# Patient Record
Sex: Female | Born: 1995 | Race: White | Hispanic: No | Marital: Single | Smoking: Former smoker
Health system: Southern US, Community
[De-identification: ages and names within clinical notes are randomized; demographics above are authoritative.]

## PROBLEM LIST (undated history)

## (undated) DIAGNOSIS — N949 Unspecified condition associated with female genital organs and menstrual cycle: Secondary | ICD-10-CM

## (undated) DIAGNOSIS — L723 Sebaceous cyst: Secondary | ICD-10-CM

## (undated) DIAGNOSIS — F329 Major depressive disorder, single episode, unspecified: Secondary | ICD-10-CM

## (undated) DIAGNOSIS — F419 Anxiety disorder, unspecified: Secondary | ICD-10-CM

## (undated) DIAGNOSIS — R519 Headache, unspecified: Secondary | ICD-10-CM

## (undated) DIAGNOSIS — T4145XA Adverse effect of unspecified anesthetic, initial encounter: Secondary | ICD-10-CM

## (undated) DIAGNOSIS — R071 Chest pain on breathing: Secondary | ICD-10-CM

## (undated) DIAGNOSIS — F32A Depression, unspecified: Secondary | ICD-10-CM

## (undated) DIAGNOSIS — T8859XA Other complications of anesthesia, initial encounter: Secondary | ICD-10-CM

## (undated) DIAGNOSIS — R51 Headache: Secondary | ICD-10-CM

## (undated) DIAGNOSIS — R599 Enlarged lymph nodes, unspecified: Secondary | ICD-10-CM

## (undated) DIAGNOSIS — M25559 Pain in unspecified hip: Secondary | ICD-10-CM

## (undated) DIAGNOSIS — M25569 Pain in unspecified knee: Secondary | ICD-10-CM

## (undated) HISTORY — DX: Enlarged lymph nodes, unspecified: R59.9

## (undated) HISTORY — PX: KNEE SURGERY: SHX244

## (undated) HISTORY — DX: Pain in unspecified knee: M25.569

## (undated) HISTORY — DX: Chest pain on breathing: R07.1

## (undated) HISTORY — DX: Sebaceous cyst: L72.3

## (undated) HISTORY — PX: MOUTH SURGERY: SHX715

## (undated) HISTORY — DX: Unspecified condition associated with female genital organs and menstrual cycle: N94.9

## (undated) HISTORY — DX: Pain in unspecified hip: M25.559

## (undated) HISTORY — PX: TONSILLECTOMY: SUR1361

---
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
## 2016-01-05 ENCOUNTER — Ambulatory Visit: Payer: BLUE CROSS/BLUE SHIELD | Admitting: Family Medicine

## 2016-01-05 DIAGNOSIS — Z0289 Encounter for other administrative examinations: Secondary | ICD-10-CM

## 2016-02-10 ENCOUNTER — Telehealth: Payer: Self-pay | Admitting: Family Medicine

## 2016-02-10 NOTE — Telephone Encounter (Signed)
° ° ° °  Pt request refill of the following: ° ° °amphetamine-dextroamphetamine (ADDERALL XR) 20 MG 24 hr capsule ° ° °Phamacy: °

## 2016-02-11 ENCOUNTER — Other Ambulatory Visit: Payer: Self-pay | Admitting: Emergency Medicine

## 2016-02-11 MED ORDER — AMPHETAMINE-DEXTROAMPHET ER 20 MG PO CP24: 20.0000 mg | ORAL_CAPSULE | Freq: Two times a day (BID) | ORAL | 0 refills | Status: DC

## 2016-02-11 NOTE — Telephone Encounter (Signed)
Called and informed pt that prescription has been printed and signed. Upfront ready for pick up nothing further needed at this time.

## 2016-02-21 ENCOUNTER — Other Ambulatory Visit: Payer: Self-pay | Admitting: Family Medicine

## 2016-03-21 ENCOUNTER — Other Ambulatory Visit: Payer: Self-pay | Admitting: Family Medicine

## 2016-03-21 NOTE — Telephone Encounter (Signed)
Pt needs new rx generic adderall xr 30 °

## 2016-03-22 ENCOUNTER — Other Ambulatory Visit: Payer: Self-pay | Admitting: Emergency Medicine

## 2016-03-22 MED ORDER — AMPHETAMINE-DEXTROAMPHET ER 20 MG PO CP24: 20.0000 mg | ORAL_CAPSULE | Freq: Two times a day (BID) | ORAL | 0 refills | Status: DC

## 2016-03-22 NOTE — Telephone Encounter (Signed)
Called pt unable to leave message for voicemail has not been set up yet.   Prescription printed and placed up front for pick up.

## 2016-03-23 NOTE — Telephone Encounter (Signed)
Spoke with patient and told her that her prescription is ready to picked up.

## 2016-05-02 ENCOUNTER — Ambulatory Visit: Payer: BLUE CROSS/BLUE SHIELD | Admitting: Family Medicine

## 2016-05-17 ENCOUNTER — Ambulatory Visit (INDEPENDENT_AMBULATORY_CARE_PROVIDER_SITE_OTHER): Payer: BLUE CROSS/BLUE SHIELD | Admitting: Family Medicine

## 2016-05-17 ENCOUNTER — Encounter: Payer: Self-pay | Admitting: Family Medicine

## 2016-05-17 VITALS — BP 130/90 | HR 96 | Temp 98.1°F | Ht 62.0 in | Wt 101.0 lb

## 2016-05-17 DIAGNOSIS — R1110 Vomiting, unspecified: Secondary | ICD-10-CM | POA: Insufficient documentation

## 2016-05-17 DIAGNOSIS — F419 Anxiety disorder, unspecified: Secondary | ICD-10-CM | POA: Diagnosis not present

## 2016-05-17 LAB — POCT URINE PREGNANCY: Preg Test, Ur: NEGATIVE

## 2016-05-17 MED ORDER — LORAZEPAM 0.5 MG PO TABS: 0.5000 mg | ORAL_TABLET | Freq: Two times a day (BID) | ORAL | 1 refills | Status: DC | PRN

## 2016-05-17 NOTE — Patient Instructions (Signed)
Ativan 0.5.............Marland Kitchen. 1 at bedtime for sleep  Labs today  Call [facility]........... [phone] today and make an appointment to see somebody ASAP

## 2016-05-17 NOTE — Progress Notes (Signed)
Pre visit review using our clinic review tool, if applicable. No additional management support is needed unless otherwise documented below in the visit note. 

## 2016-05-17 NOTE — Progress Notes (Signed)
Karen Moody is a 21 year old single female nonsmoker who comes in today for evaluation of anxiety  She said 2 weeks ago she began have symptoms of anxiety manifested by inability to sleep nausea vomiting and diarrhea. The vomiting occurs 2 or 3 times a day the diarrhea couple times a day. She's lost 8 pounds.  Social history she moved out of her parents sounds lives in apartment with her boyfriend. 2 weeks ago when all this started she lost her job. She's trying to go to school [employer]. She has no fever  LMP was late but 2 weeks ago. She did 2 home pregnancy tests which were negative. For birth control she uses the NuvaRing.  She has a history of ADD and usually takes Adderall 20 mg twice a day however she can't take the Adderall because she can't sleep.  Through her parents insurance she's called somebody and talk to somebody on the hot line for anxiety.  BP 130/90 (BP Location: Right Arm, Patient Position: Sitting, Cuff Size: Normal)   Pulse 96   Temp 98.1 F (36.7 C) (Oral)   Ht 5\' 2"  (1.575 m)   Wt 101 lb (45.8 kg)   BMI 18.47 kg/m  Examination HEENT were negative neck was supple no adenopathy adenopathy abdominal exam negative  Pregnancy test negative  #1 acute anxiety with physiologic symptoms of nausea vomiting rule out organic causes..........Marland Kitchen. recommended lab work....... patient declined to get her lab work today such have to schedule it she can't stand gets stuck with needles it causes more anxiety........ I explained to her because of her symptoms of weight loss we need to get lab work done now however she again declines.  Ativan 0.5 daily at bedtime for sleep  Recommended she call [facility] and get it set up to see a counselor in person not on the phone ASAP

## 2016-05-20 ENCOUNTER — Other Ambulatory Visit: Payer: BLUE CROSS/BLUE SHIELD

## 2016-05-20 LAB — BASIC METABOLIC PANEL
BUN: 12 mg/dL (ref 6–23)
CO2: 22 mEq/L (ref 19–32)
Calcium: 9.3 mg/dL (ref 8.4–10.5)
Chloride: 103 mEq/L (ref 96–112)
Creatinine, Ser: 0.74 mg/dL (ref 0.40–1.20)
GFR: 106.27 mL/min (ref >60.00)
Glucose, Bld: 61 mg/dL — ABNORMAL LOW (ref 70–99)
Potassium: 4 mEq/L (ref 3.5–5.1)
Sodium: 137 mEq/L (ref 135–145)

## 2016-05-20 LAB — TSH: TSH: 1.08 u[IU]/mL (ref 0.35–5.50)

## 2016-05-20 LAB — HEPATIC FUNCTION PANEL
ALT: 13 U/L (ref 0–35)
AST: 22 U/L (ref 0–37)
Albumin: 4.6 g/dL (ref 3.5–5.2)
Alkaline Phosphatase: 55 U/L (ref 39–117)
Bilirubin, Direct: 0.2 mg/dL (ref 0.0–0.3)
Total Bilirubin: 0.6 mg/dL (ref 0.2–1.2)
Total Protein: 7.1 g/dL (ref 6.0–8.3)

## 2016-05-20 LAB — CBC WITH DIFFERENTIAL/PLATELET
Basophils Absolute: 0.1 10*3/uL (ref 0.0–0.1)
Basophils Relative: 0.9 % (ref 0.0–3.0)
Eosinophils Absolute: 0.1 10*3/uL (ref 0.0–0.7)
Eosinophils Relative: 1.9 % (ref 0.0–5.0)
HCT: 42.1 % (ref 36.0–46.0)
Hemoglobin: 14.4 g/dL (ref 12.0–15.0)
Lymphocytes Relative: 21.1 % (ref 12.0–46.0)
Lymphs Abs: 1.2 10*3/uL (ref 0.7–4.0)
MCHC: 34.2 g/dL (ref 30.0–36.0)
MCV: 92.8 fl (ref 78.0–100.0)
Monocytes Absolute: 0.6 10*3/uL (ref 0.1–1.0)
Monocytes Relative: 10.5 % (ref 3.0–12.0)
Neutro Abs: 3.9 10*3/uL (ref 1.4–7.7)
Neutrophils Relative %: 65.6 % (ref 43.0–77.0)
Platelets: 342 10*3/uL (ref 150.0–400.0)
RBC: 4.54 Mil/uL (ref 3.87–5.11)
RDW: 13 % (ref 11.5–14.6)
WBC: 5.9 10*3/uL (ref 4.5–10.5)

## 2016-07-08 ENCOUNTER — Telehealth: Payer: Self-pay | Admitting: Family Medicine

## 2016-07-08 DIAGNOSIS — G43801 Other migraine, not intractable, with status migrainosus: Secondary | ICD-10-CM

## 2016-07-08 MED ORDER — TRAMADOL HCL 50 MG PO TABS: ORAL_TABLET | ORAL | 0 refills | Status: DC

## 2016-07-08 NOTE — Telephone Encounter (Signed)
Left a message for a return call.  Gentleman on the other end said the pt could not come to the phone and asked if he could take a message. Asked that she call back asap and that I would be here until 5.  Have tried to reach Dr. Tawanna Cooler.  Need to know if the pt is actively having a migraine or just need a refill.

## 2016-07-08 NOTE — Telephone Encounter (Signed)
° ° °  Pt call to say Dr Tawanna Cooler has given her TRAMADOL in the past for her migraines and that he has not filled it in a while and is asking if he will refill  Also req  Refill on   amphetamine-dextroamphetamine (ADDERALL XR) 20 MG 24 hr     Ph[facility]acy  [facility] [address] [place]

## 2016-07-08 NOTE — Telephone Encounter (Signed)
Per Dr. Tawanna Cooler, ok to fill tramadol once.  Called to the pharmacy and left on machine.  Needs to make appt for refills of Adderall.  Please help

## 2016-07-12 NOTE — Telephone Encounter (Signed)
° ° °  Lm on 07/11/16 to call the office,,

## 2016-08-04 ENCOUNTER — Telehealth: Payer: Self-pay | Admitting: Family Medicine

## 2016-08-04 NOTE — Telephone Encounter (Signed)
Pt need new rx generic adderall xr 20 mg. Pt is aware md out of office until monday

## 2016-08-05 NOTE — Telephone Encounter (Signed)
Okay to wait for Dr Tawanna Coolerodd

## 2016-08-08 NOTE — Telephone Encounter (Signed)
lmom for pt to call back

## 2016-08-08 NOTE — Telephone Encounter (Signed)
Over a year since seen for ADD.  Needs to get on the schedule.  Thanks!!

## 2016-08-11 NOTE — Telephone Encounter (Signed)
lmom for pt to call back

## 2016-08-16 NOTE — Telephone Encounter (Signed)
lmom for pt to call back

## 2017-01-05 ENCOUNTER — Ambulatory Visit (INDEPENDENT_AMBULATORY_CARE_PROVIDER_SITE_OTHER): Payer: BLUE CROSS/BLUE SHIELD | Admitting: Adult Health

## 2017-01-05 VITALS — BP 118/74 | Temp 98.5°F | Wt 123.0 lb

## 2017-01-05 DIAGNOSIS — J020 Streptococcal pharyngitis: Secondary | ICD-10-CM

## 2017-01-05 MED ORDER — AZITHROMYCIN 250 MG PO TABS: ORAL_TABLET | ORAL | 0 refills | Status: DC

## 2017-01-05 MED ORDER — MAGIC MOUTHWASH W/LIDOCAINE: 5.0000 mL | Freq: Three times a day (TID) | ORAL | 0 refills | Status: DC | PRN

## 2017-01-05 MED ORDER — PREDNISONE 10 MG PO TABS: 10.0000 mg | ORAL_TABLET | Freq: Every day | ORAL | 0 refills | Status: DC
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
## 2017-01-06 LAB — POCT RAPID STREP A (OFFICE): Rapid Strep A Screen: POSITIVE — AB

## 2017-02-27 ENCOUNTER — Telehealth: Payer: Self-pay | Admitting: Family Medicine

## 2017-02-27 NOTE — Telephone Encounter (Signed)
Copied from CRM [id number]. Topic: Inquiry >> Feb 27, 2017  4:35 PM Alexander BergeronBarksdale, Harvey B wrote: Reason for CRM: pt would like her Rx of adderal refilled, contact pt when ready

## 2017-02-28 ENCOUNTER — Other Ambulatory Visit: Payer: Self-pay

## 2017-02-28 MED ORDER — AMPHETAMINE-DEXTROAMPHET ER 20 MG PO CP24: 20.0000 mg | ORAL_CAPSULE | Freq: Two times a day (BID) | ORAL | 0 refills | Status: DC

## 2017-02-28 NOTE — Telephone Encounter (Signed)
Called pt left a message to return my call in the office 

## 2017-02-28 NOTE — Telephone Encounter (Signed)
Rx has been printed awaiting for signing.

## 2017-03-02 NOTE — Telephone Encounter (Signed)
Pt is returning nancy call. Pt is aware nancy unavailable

## 2017-03-02 NOTE — Telephone Encounter (Signed)
Attempted to reach pt X2 with no success, left message to call office regarding to her medication refill request.

## 2017-03-03 NOTE — Telephone Encounter (Signed)
Attempted to call pt in regards to her Rx pick up, spoke with the father voiced understanding that I had problems reaching out to the pt, father stated that he would tell her the Rx is ready for pick up at the office.

## 2017-04-07 ENCOUNTER — Telehealth: Payer: Self-pay | Admitting: Family Medicine

## 2017-04-07 NOTE — Telephone Encounter (Signed)
Routed to provider

## 2017-04-07 NOTE — Telephone Encounter (Signed)
Copied from CRM [phone]. Topic: Quick Communication - Rx Refill/Question >> Apr 07, 2017  3:22 PM Landry MellowFoltz, Melissa J wrote: Has the patient contacted their pharmacy? No. Pick up only    (Agent: If no, request that the patient contact the pharmacy for the refill.)   Preferred Pharmacy (with phone number or street name):call [phone] when ready for pick up. Pt needs refill on amphetamine-dextroamphetamine (ADDERALL XR) 20 MG 24 hr    Agent: Please be advised that RX refills may take up to 3 business days. We ask that you follow-up with your pharmacy.

## 2017-04-10 ENCOUNTER — Other Ambulatory Visit: Payer: Self-pay

## 2017-04-10 MED ORDER — AMPHETAMINE-DEXTROAMPHET ER 20 MG PO CP24: 20.0000 mg | ORAL_CAPSULE | Freq: Two times a day (BID) | ORAL | 0 refills | Status: DC

## 2017-04-10 NOTE — Telephone Encounter (Signed)
Rx has been printed,signed and pt is aware to pick up at the office.

## 2017-05-08 ENCOUNTER — Telehealth: Payer: Self-pay | Admitting: Family Medicine

## 2017-05-08 ENCOUNTER — Other Ambulatory Visit: Payer: Self-pay

## 2017-05-08 MED ORDER — AMPHETAMINE-DEXTROAMPHET ER 20 MG PO CP24: 20.0000 mg | ORAL_CAPSULE | Freq: Two times a day (BID) | ORAL | 0 refills | Status: DC

## 2017-05-08 NOTE — Telephone Encounter (Signed)
Copied from CRM [id number]. Topic: General - Other >> May 08, 2017 10:07 AM Cecelia ByarsGreen, Temeka L, RMA wrote: Reason for CRM: Medication refill request for Adderall 20 mg, please call pt when prescription is ready for pickup

## 2017-05-08 NOTE — Telephone Encounter (Signed)
Rx has been printed, waiting Dr Tawanna Coolerodd to sign

## 2017-05-09 NOTE — Telephone Encounter (Signed)
Spoke with pt aware that Rx for Adderral is ready for pick up at the front desk

## 2017-05-10 ENCOUNTER — Ambulatory Visit: Payer: BLUE CROSS/BLUE SHIELD | Admitting: Family Medicine

## 2017-05-10 ENCOUNTER — Encounter: Payer: Self-pay | Admitting: Family Medicine

## 2017-05-10 VITALS — BP 120/70 | HR 103 | Temp 98.7°F | Wt 102.2 lb

## 2017-05-10 DIAGNOSIS — R05 Cough: Secondary | ICD-10-CM | POA: Diagnosis not present

## 2017-05-10 DIAGNOSIS — R059 Cough, unspecified: Secondary | ICD-10-CM

## 2017-05-10 DIAGNOSIS — G43801 Other migraine, not intractable, with status migrainosus: Secondary | ICD-10-CM | POA: Diagnosis not present

## 2017-05-10 DIAGNOSIS — R52 Pain, unspecified: Secondary | ICD-10-CM

## 2017-05-10 LAB — POCT INFLUENZA A/B
Influenza A, POC: NEGATIVE
Influenza B, POC: NEGATIVE

## 2017-05-10 MED ORDER — BENZONATATE 100 MG PO CAPS: 100.0000 mg | ORAL_CAPSULE | Freq: Three times a day (TID) | ORAL | 0 refills | Status: DC | PRN

## 2017-05-10 MED ORDER — HYDROCODONE-HOMATROPINE 5-1.5 MG/5ML PO SYRP: 5.0000 mL | ORAL_SOLUTION | Freq: Four times a day (QID) | ORAL | 0 refills | Status: AC | PRN

## 2017-05-10 MED ORDER — TRAMADOL HCL 50 MG PO TABS: ORAL_TABLET | ORAL | 0 refills | Status: DC
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
## 2017-05-10 NOTE — Patient Instructions (Signed)

## 2017-06-08 ENCOUNTER — Other Ambulatory Visit: Payer: Self-pay | Admitting: Family Medicine

## 2017-06-08 NOTE — Telephone Encounter (Signed)
LOV: 05/17/16  Dr[facility]erodd  [facility] [address] [facility] [place]

## 2017-06-08 NOTE — Telephone Encounter (Signed)
Copied from CRM [phone]. Topic: Quick Communication - Rx Refill/Question >> Jun 08, 2017  2:49 PM Arlyss Gandyichardson, Alejandra Hunt N, NT wrote: Medication:  amphetamine-dextroamphetamine (ADDERALL XR) 20    Has the patient contacted their pharmacy? Yes.     (Agent: If no, request that the patient contact the pharmacy for the refill.)   Preferred Pharmacy (with phone number or street name[facility] [address] [facility] [place]   Agent: Please be advised that RX refills may take up to 3 business days. We ask that you follow-up with your pharmacy.

## 2017-06-09 ENCOUNTER — Telehealth: Payer: Self-pay

## 2017-06-09 MED ORDER — AMPHETAMINE-DEXTROAMPHET ER 20 MG PO CP24: 20.0000 mg | ORAL_CAPSULE | Freq: Two times a day (BID) | ORAL | 0 refills | Status: DC

## 2017-06-09 NOTE — Telephone Encounter (Signed)
Patient Rx for Adderall has been printed, awaiting Dr Tawanna Coolerodd to come in an sign,spoke with him this morning states that he will pass by the office to sign the Rx.

## 2017-06-09 NOTE — Telephone Encounter (Signed)
Spoke with pt father voiced understanding that Rx for Adderall is ready for pick up at the office

## 2017-08-17 ENCOUNTER — Ambulatory Visit (INDEPENDENT_AMBULATORY_CARE_PROVIDER_SITE_OTHER): Payer: BLUE CROSS/BLUE SHIELD | Admitting: Adult Health

## 2017-08-17 ENCOUNTER — Encounter: Payer: Self-pay | Admitting: Family Medicine

## 2017-08-17 ENCOUNTER — Encounter: Payer: Self-pay | Admitting: Adult Health

## 2017-08-17 VITALS — BP 114/70 | Temp 98.3°F | Wt 114.0 lb

## 2017-08-17 DIAGNOSIS — M25552 Pain in left hip: Secondary | ICD-10-CM
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
## 2017-08-18 ENCOUNTER — Telehealth: Payer: Self-pay | Admitting: Family Medicine

## 2017-08-18 NOTE — Addendum Note (Signed)
Addended by: Raj Janus T on: 08/18/2017 01:55 PM   Modules accepted: Orders

## 2017-08-18 NOTE — Telephone Encounter (Signed)
Tried to reach the pt.  No answering machine available.

## 2017-08-18 NOTE — Telephone Encounter (Signed)
Copied from CRM [id number]. Topic: General - Other >> Aug 18, 2017  9:14 AM Oneal Grout wrote: Reason for CRM: Need to extend work note to 08/21/17, requesting order for hip xray to be done in [place]. Please advise

## 2017-08-18 NOTE — Telephone Encounter (Signed)
Please Advice 

## 2017-08-21 ENCOUNTER — Telehealth: Payer: Self-pay | Admitting: Family Medicine

## 2017-08-21 NOTE — Telephone Encounter (Signed)
Copied from CRM [id number]. Topic: Inquiry >> Aug 21, 2017 12:55 PM Alexander Bergeron B wrote: Reason for CRM: pt called about an X-ray that Nafziger told her to get but she said she wasn't given an order or instructions on where to get it and she is needing an extension on her work note, call pt to advise

## 2017-08-22 NOTE — Telephone Encounter (Signed)
Call pt back when possible °

## 2017-08-22 NOTE — Telephone Encounter (Signed)
Other (work note )   Nimmons, Amie Critchley, RN routed conversation to [place] 19 hours ago (1:13 PM)    Nimmons, Amie Critchley, RN 19 hours ago (1:13 PM)      Copied from [employer] [phone]. Topic: Inquiry >> Aug 21, 2017 12:55 PM Alexander Bergeron B wrote: Reason for CRM: pt called about an X-ray that Nafziger told her to get but she said she wasn't given an order or instructions on where to get it and she is needing an extension on her work note, call pt to advise

## 2017-08-22 NOTE — Telephone Encounter (Signed)
Duplicate message. 

## 2017-08-22 NOTE — Telephone Encounter (Signed)
Tried to reach the pt.  Unable to leave a message.  Will try again at a later time. 

## 2017-08-23 ENCOUNTER — Ambulatory Visit (INDEPENDENT_AMBULATORY_CARE_PROVIDER_SITE_OTHER)
Admission: RE | Admit: 2017-08-23 | Discharge: 2017-08-23 | Disposition: A | Discharge status: Home / Self Care | Payer: BLUE CROSS/BLUE SHIELD | Source: Ambulatory Visit | Attending: Adult Health | Admitting: Adult Health

## 2017-08-23 ENCOUNTER — Encounter: Payer: Self-pay | Admitting: Family Medicine

## 2017-08-23 ENCOUNTER — Ambulatory Visit: Payer: BLUE CROSS/BLUE SHIELD | Admitting: Adult Health

## 2017-08-23 ENCOUNTER — Encounter: Payer: Self-pay | Admitting: Adult Health

## 2017-08-23 VITALS — BP 100/70 | Temp 98.2°F | Wt 107.0 lb

## 2017-08-23 DIAGNOSIS — M25552 Pain in left hip: Secondary | ICD-10-CM | POA: Diagnosis not present

## 2017-08-23 MED ORDER — METHYLPREDNISOLONE 4 MG PO TBPK
ORAL_TABLET | ORAL | 0 refills | Status: DC
Start: 2017-08-23 — End: 2018-04-10

## 2017-08-23 MED ORDER — CYCLOBENZAPRINE HCL 5 MG PO TABS: 5.0000 mg | ORAL_TABLET | Freq: Every day | ORAL | 1 refills | Status: DC
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
## 2017-08-23 NOTE — Telephone Encounter (Signed)
Tried reaching pt.  No answer or machine.  Will try again at a later time.

## 2017-08-23 NOTE — Addendum Note (Signed)
Addended by: Raj Janus T on: 08/23/2017 01:25 PM   Modules accepted: Orders

## 2017-08-23 NOTE — Telephone Encounter (Signed)
Pt completed x-ray today before follow up visit with [facility].  No further action required.  Will see pt in ov.

## 2017-08-25 ENCOUNTER — Encounter: Payer: Self-pay | Admitting: Family Medicine
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
## 2017-09-27 ENCOUNTER — Other Ambulatory Visit: Payer: Self-pay | Admitting: Family Medicine

## 2017-09-27 NOTE — Telephone Encounter (Signed)
Copied from CRM [phone]#[id number]. Topic: Quick Communication - See Telephone Encounter >> Sep 27, 2017  4:30 PM Trula SladeWalter, Linda F wrote: CRM for notification. See Telephone encounter for: 09/27/17. Patient would like a refill on her amphetamine-dextroamphetamine (ADDERALL XR) 20 MG 24 hr capsule medication and [facility] [address] [facility] [place].  Patient would prefer the prescription sent to the pharmacy.

## 2017-09-28 NOTE — Telephone Encounter (Signed)
Patient is requesting medication sent to the pharmacy.  Adderall refill Last OV:07/06/15 Last refill:06/09/17 60 cap/0 refill ZOX:[id number]:[facility] [address] [place], [place] - [address] [phone] (Phone) [phone] (Fax)

## 2017-09-29 NOTE — Telephone Encounter (Signed)
Dr Tawanna Coolerodd pt, Please Advise if ok to refill.

## 2017-10-09 MED ORDER — AMPHETAMINE-DEXTROAMPHET ER 20 MG PO CP24: 20.0000 mg | ORAL_CAPSULE | Freq: Two times a day (BID) | ORAL | 0 refills | Status: DC

## 2017-10-10 NOTE — Telephone Encounter (Signed)
Rx approved signed and pt is aware to pick up Rx at the front office

## 2017-10-11 NOTE — Telephone Encounter (Signed)
Spoke with pt dad voiced understanding that Rx for Adderall is ready for pick up at the office front desk.

## 2017-11-29 ENCOUNTER — Telehealth: Payer: Self-pay | Admitting: Family Medicine

## 2017-11-29 NOTE — Telephone Encounter (Unsigned)
Copied from CRM [id number]. Topic: Quick Communication - Rx Refill/Question >> Nov 29, 2017  4:54 PM Raquel SarnaHayes, Teresa G wrote: amphetamine-dextroamphetamine (ADDERALL XR) 20 MG 24 hr capsule  Needing a refill  [id number] [place] [place], [place] - [address] [phone] (Phone) [phone] (Fax)

## 2017-11-30 ENCOUNTER — Other Ambulatory Visit: Payer: Self-pay | Admitting: Family Medicine

## 2017-11-30 MED ORDER — AMPHETAMINE-DEXTROAMPHET ER 20 MG PO CP24: 20.0000 mg | ORAL_CAPSULE | ORAL | 0 refills | Status: DC

## 2017-11-30 MED ORDER — AMPHETAMINE-DEXTROAMPHET ER 20 MG PO CP24: 20.0000 mg | ORAL_CAPSULE | Freq: Two times a day (BID) | ORAL | 0 refills | Status: DC

## 2017-11-30 MED ORDER — AMPHETAMINE-DEXTROAMPHET ER 20 MG PO CP24: ORAL_CAPSULE | ORAL | 0 refills | Status: DC

## 2017-11-30 NOTE — Telephone Encounter (Signed)
Please Advise

## 2017-12-05 NOTE — Telephone Encounter (Signed)
Refill of adderall  Pt states medication was sent to the wrong pharmacy. Pt is requesting it be resent to  [id number] [place], [place] - [address] [phone] (Phone) [phone] (Fax)

## 2017-12-05 NOTE — Telephone Encounter (Signed)
Pt called stating that this medication was sent to the wrong pharmacy. Pt is requesting it be resent to  [id number] [place], [place] - [address] [phone] (Phone) [phone] (Fax)

## 2017-12-06 ENCOUNTER — Other Ambulatory Visit: Payer: Self-pay | Admitting: Family Medicine

## 2017-12-06 MED ORDER — AMPHETAMINE-DEXTROAMPHET ER 20 MG PO CP24: 20.0000 mg | ORAL_CAPSULE | Freq: Two times a day (BID) | ORAL | 0 refills | Status: DC

## 2017-12-06 MED ORDER — AMPHETAMINE-DEXTROAMPHET ER 20 MG PO CP24: ORAL_CAPSULE | ORAL | 0 refills | Status: DC

## 2017-12-06 MED ORDER — AMPHETAMINE-DEXTROAMPHET ER 20 MG PO CP24: 20.0000 mg | ORAL_CAPSULE | ORAL | 0 refills | Status: DC

## 2018-01-04 ENCOUNTER — Other Ambulatory Visit: Payer: Self-pay | Admitting: Family Medicine

## 2018-01-04 NOTE — Telephone Encounter (Signed)
Copied from CRM [id number]. Topic: Quick Communication - Rx Refill/Question >> Jan 04, 2018  4:25 PM Angela Nevin wrote: Medication: amphetamine-dextroamphetamine (ADDERALL XR) 20 MG 24 hr capsule   Has the patient contacted their pharmacy? Yes, pt was advised to contact office   Preferred Pharmacy (with phone number or street name):[phone] [place], [place] - [address] [phone] (Phone) [phone] (Fax)

## 2018-01-04 NOTE — Telephone Encounter (Signed)
Requested medication (s) are due for refill today: no  Requested medication (s) are on the active medication list: yes    Last refill: 12/06/17  #60  0 refills  Future visit scheduled    no  Notes to clinic: Please clarify; 2 prescriptions, 1 BID, one QD both on 12/06/17   Requested Prescriptions  Pending Prescriptions Disp Refills   amphetamine-dextroamphetamine (ADDERALL XR) 20 MG 24 hr capsule 60 capsule 0    Sig: Take 1 capsule (20 mg total) by mouth every morning.     Not Delegated - Psychiatry:  Stimulants/ADHD Failed - 01/04/2018  4:37 PM      Failed - This refill cannot be delegated      Failed - Urine Drug Screen completed in last 360 days.      Failed - Valid encounter within last 3 months    Recent Outpatient Visits          4 months ago Left hip pain   [facility], [place], NP   4 months ago Left hip pain   [facility], [place], NP   7 months ago Cough   [facility], Elberta Fortis, MD   12 months ago Strep throat   [facility], [place], NP   1 year ago Vomiting, intractability of vomiting not specified, presence of nausea not specified, unspecified vomiting type   [facility] [employer], Eugenio Hoes, MD

## 2018-01-05 NOTE — Telephone Encounter (Signed)
Okay for refill? Please advise 

## 2018-01-08 MED ORDER — AMPHETAMINE-DEXTROAMPHET ER 20 MG PO CP24: 20.0000 mg | ORAL_CAPSULE | ORAL | 0 refills | Status: DC

## 2018-01-08 NOTE — Telephone Encounter (Signed)
See attached.   Pt states the pharmacy received the wrong Rx.   The instructions should be 2x a day, 1x a day was sent.     Please advise.

## 2018-01-08 NOTE — Telephone Encounter (Signed)
Pt states that the pharmacy received the wrong RX. Pt states the instructions should be 2x a day, 1x a day was sent. Please advise.

## 2018-02-12 ENCOUNTER — Ambulatory Visit: Payer: BLUE CROSS/BLUE SHIELD | Admitting: Family Medicine

## 2018-02-12 DIAGNOSIS — Z0289 Encounter for other administrative examinations: Secondary | ICD-10-CM

## 2018-02-12 NOTE — Progress Notes (Deleted)
   Subjective:    Patient ID: Karen Moody, female    DOB: 1995-10-29, 22 y.o.   MRN: [id number]  HPI  Presents to clinic c/o congestion, fluid in ears, sore throat for...  Patient Active Problem List   Diagnosis Date Noted  . Vomiting 05/17/2016  . Anxiety 05/17/2016  . Neck pain 07/06/2015  . Left knee pain 12/09/2014  . ADD (attention deficit disorder) 06/25/2014  . Adjustment disorder with mixed anxiety and depressed mood 06/19/2014  . Right shoulder pain 02/10/2014  . Headache, migraine 05/29/2012  . DUB (dysfunctional uterine bleeding) 02/24/2009   Social History   Tobacco Use  . Smoking status: Former Smoker    Packs/day: 0.25    Types: Cigarettes  . Smokeless tobacco: Never Used  Substance Use Topics  . Alcohol use: Yes    Comment: rarely   Review of Systems  Constitutional: Negative for chills, fatigue and fever.  HENT: +congestion, ear pain, sinus pain and sore throat.   Eyes: Negative.   Respiratory: Negative for cough, shortness of breath and wheezing.   Cardiovascular: Negative for chest pain, palpitations and leg swelling.  Gastrointestinal: Negative for abdominal pain, diarrhea, nausea and vomiting.  Genitourinary: Negative for dysuria, frequency and urgency.  Musculoskeletal: Negative for arthralgias and myalgias.  Skin: Negative for color change, pallor and rash.  Neurological: Negative for syncope, light-headedness and headaches.  Psychiatric/Behavioral: The patient is not nervous/anxious.       Objective:   Physical Exam        Assessment & Plan:

## 2018-03-12 ENCOUNTER — Other Ambulatory Visit: Payer: Self-pay | Admitting: Family Medicine

## 2018-03-12 NOTE — Telephone Encounter (Signed)
Copied from CRM [id number]. Topic: Quick Communication - Rx Refill/Question >> Mar 12, 2018  9:27 AM Jay SchlichterWeikart, Melissa J wrote: Medication: amphetamine-dextroamphetamine (ADDERALL XR) 20 MG 24 hr capsule   Has the patient contacted their pharmacy? Yes.   (Agent: If no, request that the patient contact the pharmacy for the refill.) (Agent: If yes, when and what did the pharmacy advise?)call the pharm   Preferred Pharmacy (with phone number or street name): [facility] [address]   Agent: Please be advised that RX refills may take up to 3 business days. We ask that you follow-up with your pharmacy.

## 2018-03-19 NOTE — Telephone Encounter (Signed)
Pt called needing Rx sent in ASAP Not sure why this was not previously handled  Pt needs New Patient appt prior to getting refills.   [facility] -- there are no appts (per scheduled protocol) until mid January 2020. The patient was very adamant to get this refilled asap.

## 2018-03-19 NOTE — Telephone Encounter (Signed)
Attempted to contact pt to advise we have several openings with providers as soon as tomorrow. Phone says it is not working at this time. No other numbers in chart. No My Chart.

## 2018-04-05 ENCOUNTER — Encounter: Payer: BLUE CROSS/BLUE SHIELD | Admitting: Family Medicine

## 2018-04-05 DIAGNOSIS — Z0289 Encounter for other administrative examinations: Secondary | ICD-10-CM

## 2018-04-05 NOTE — Progress Notes (Deleted)
   Subjective:    Patient ID: Karen Moody, female    DOB: June 08, 1995, 23 y.o.   MRN: [id number]  HPI  Presents to clinic as a TOC from Dr Tawanna Cooler  Patient Active Problem List   Diagnosis Date Noted  . Vomiting 05/17/2016  . Anxiety 05/17/2016  . Neck pain 07/06/2015  . Left knee pain 12/09/2014  . ADD (attention deficit disorder) 06/25/2014  . Adjustment disorder with mixed anxiety and depressed mood 06/19/2014  . Right shoulder pain 02/10/2014  . Headache, migraine 05/29/2012  . DUB (dysfunctional uterine bleeding) 02/24/2009   Social History   Tobacco Use  . Smoking status: Former Smoker    Packs/day: 0.25    Types: Cigarettes  . Smokeless tobacco: Never Used  Substance Use Topics  . Alcohol use: Yes    Comment: rarely   Past Surgical History:  Procedure Laterality Date  . CHONDROPLASTY Left 10/29/2015   Procedure: CHONDROPLASTY;  Surgeon: Loreta Ave, MD;  Location: [facility];  Service: Orthopedics;  Laterality: Left;  . KNEE ARTHROSCOPY WITH EXCISION PLICA Left 10/29/2015   Procedure: LEFT KNEE ARTHROSCOPY  ,SYNOVECTOMY EXCISION OF PLICA;  Surgeon: Loreta Ave, MD;  Location: [facility];  Service: Orthopedics;  Laterality: Left;  . KNEE SURGERY    . MOUTH SURGERY    . TONSILLECTOMY     Social History   Tobacco Use  . Smoking status: Former Smoker    Packs/day: 0.25    Types: Cigarettes  . Smokeless tobacco: Never Used  Substance Use Topics  . Alcohol use: Yes    Comment: rarely   Review of Systems  Constitutional: Negative for chills, fatigue and fever.  HENT: Negative for congestion, ear pain, sinus pain and sore throat.   Eyes: Negative.   Respiratory: Negative for cough, shortness of breath and wheezing.   Cardiovascular: Negative for chest pain, palpitations and leg swelling.  Gastrointestinal: Negative for abdominal pain, diarrhea, nausea and vomiting.  Genitourinary: Negative for dysuria, frequency and urgency.    Musculoskeletal: Negative for arthralgias and myalgias.  Skin: Negative for color change, pallor and rash.  Neurological: Negative for syncope, light-headedness and headaches.  Psychiatric/Behavioral: The patient is not nervous/anxious.       Objective:   Physical Exam        Assessment & Plan:

## 2018-04-10 ENCOUNTER — Ambulatory Visit: Payer: 59 | Admitting: Family Medicine

## 2018-04-10 ENCOUNTER — Telehealth: Payer: Self-pay

## 2018-04-10 ENCOUNTER — Encounter: Payer: Self-pay | Admitting: Family Medicine

## 2018-04-10 VITALS — BP 92/58 | HR 90 | Temp 97.9°F | Ht 63.0 in | Wt 117.0 lb

## 2018-04-10 DIAGNOSIS — Z3044 Encounter for surveillance of vaginal ring hormonal contraceptive device: Secondary | ICD-10-CM | POA: Diagnosis not present

## 2018-04-10 DIAGNOSIS — R0981 Nasal congestion: Secondary | ICD-10-CM | POA: Diagnosis not present

## 2018-04-10 DIAGNOSIS — F909 Attention-deficit hyperactivity disorder, unspecified type: Secondary | ICD-10-CM | POA: Diagnosis not present

## 2018-04-10 MED ORDER — FLUTICASONE PROPIONATE 50 MCG/ACT NA SUSP: 2.0000 | Freq: Every day | NASAL | 6 refills | Status: DC

## 2018-04-10 MED ORDER — AMPHETAMINE-DEXTROAMPHET ER 20 MG PO CP24: 20.0000 mg | ORAL_CAPSULE | Freq: Two times a day (BID) | ORAL | 0 refills | Status: DC

## 2018-04-10 MED ORDER — ETONOGESTREL-ETHINYL ESTRADIOL 0.12-0.015 MG/24HR VA RING: VAGINAL_RING | VAGINAL | 3 refills | Status: DC

## 2018-04-10 NOTE — Telephone Encounter (Signed)
Copied from CRM [id number]. Topic: General - Other >> Apr 10, 2018 11:39 AM Marylen Ponto wrote: Reason for CRM: Pt stated she went to pick up the Rx for amphetamine-dextroamphetamine (ADDERALL XR) 20 MG 24 hr capsule and she was advised that the Rx could not be filled due to the way the directions are written. [facility]

## 2018-04-10 NOTE — Telephone Encounter (Signed)
Called Pt and she stated she does want to keep 2 a day. Pt stated the way it was electronic sent they could not fill it with her insurance.

## 2018-04-10 NOTE — Progress Notes (Signed)
Subjective:    Patient ID: Karen Moody, female    DOB: Jun 02, 1995, 23 y.o.   MRN: [id number]  HPI   Presents to clinic as a TOC from Dr Tawanna Cooler, he has retired.  Current complaint is sinus congestion for past 7-10 days, has been using zyrtec with some symptom improvement.  Also needs refill on adderral for ADHD. Her concentration is stable on her current dose of 20mg  BID, has been on this for past few years with good effect.  Also needs refill of NuvaRing, has used birth control for the past year with good effect.  Was a smoker for approximately 1 year, has not smoked cigarettes in over 2 years.  Patient states she did have a normal Pap smear approximately 1 year ago.  Patient Active Problem List   Diagnosis Date Noted  . Vomiting 05/17/2016  . Anxiety 05/17/2016  . Neck pain 07/06/2015  . Left knee pain 12/09/2014  . ADD (attention deficit disorder) 06/25/2014  . Adjustment disorder with mixed anxiety and depressed mood 06/19/2014  . Right shoulder pain 02/10/2014  . Headache, migraine 05/29/2012  . DUB (dysfunctional uterine bleeding) 02/24/2009   Social History   Tobacco Use  . Smoking status: Former Smoker    Packs/day: 0.25    Types: Cigarettes  . Smokeless tobacco: Never Used  Substance Use Topics  . Alcohol use: Yes    Comment: rarely   Past Surgical History:  Procedure Laterality Date  . CHONDROPLASTY Left 10/29/2015   Procedure: CHONDROPLASTY;  Surgeon: Loreta Ave, MD;  Location: [facility];  Service: Orthopedics;  Laterality: Left;  . KNEE ARTHROSCOPY WITH EXCISION PLICA Left 10/29/2015   Procedure: LEFT KNEE ARTHROSCOPY  ,SYNOVECTOMY EXCISION OF PLICA;  Surgeon: Loreta Ave, MD;  Location: [facility];  Service: Orthopedics;  Laterality: Left;  . KNEE SURGERY    . MOUTH SURGERY    . TONSILLECTOMY     Family History  Problem Relation Age of Onset  . Cancer Mother   . Diabetes Father   . Cancer Paternal Grandfather      Review of Systems  Constitutional: Negative for chills, fatigue and fever.  HENT: +congestion, sinus congestion.  Eyes: Negative.   Respiratory: Negative for cough, shortness of breath and wheezing.   Cardiovascular: Negative for chest pain, palpitations and leg swelling.  Gastrointestinal: Negative for abdominal pain, diarrhea, nausea and vomiting.  Genitourinary: Negative for dysuria, frequency and urgency.  Musculoskeletal: Negative for arthralgias and myalgias.  Skin: Negative for color change, pallor and rash.  Neurological: Negative for syncope, light-headedness and headaches.  Psychiatric/Behavioral: The patient is not nervous/anxious.       Objective:   Physical Exam Vitals signs and nursing note reviewed.  Constitutional:      General: She is not in acute distress.    Appearance: Normal appearance. She is normal weight. She is not toxic-appearing.  HENT:     Head: Normocephalic and atraumatic.     Mouth/Throat:     Mouth: Mucous membranes are moist.  Eyes:     Extraocular Movements: Extraocular movements intact.     Conjunctiva/sclera: Conjunctivae normal.  Neck:     Musculoskeletal: Normal range of motion.  Cardiovascular:     Rate and Rhythm: Normal rate and regular rhythm.  Pulmonary:     Effort: Pulmonary effort is normal.     Breath sounds: Normal breath sounds.  Genitourinary:    Comments: Declines GU/GYN exam today Skin:    General:  Skin is warm and dry.     Coloration: Skin is not jaundiced or pale.  Neurological:     Mental Status: She is alert and oriented to person, place, and time.  Psychiatric:        Mood and Affect: Mood normal.        Behavior: Behavior normal.    Vitals:   04/10/18 1004  BP: (!) 92/58  Pulse: 90  Temp: 97.9 F (36.6 C)  SpO2: 98%      Assessment & Plan:    ADHD - patient will continue current dose of Adderall.  This dose is effective for her.  [facility] [place] checked and is appropriate for refill of  this controlled substance.  Surveillance of vaginal ring birth control - patient is stable on NuvaRing birth control, has no issues with this birth control form.  Nasal congestion - patient will continue using Zyrtec as she has been doing.  She will add Flonase to help open up nasal congestion.  Advised to rest, increase fluids and do good handwashing.  Patient will follow-up here in approximately 3 months for recheck on chronic medical conditions.  She will return to clinic sooner if any issues arise.

## 2018-04-10 NOTE — Telephone Encounter (Signed)
Patient states she has always done the XR 2 times per day, I can change it to 1x per day but patient wanted to stay on current regimen

## 2018-04-11 ENCOUNTER — Other Ambulatory Visit: Payer: Self-pay | Admitting: Family Medicine

## 2018-04-11 DIAGNOSIS — F909 Attention-deficit hyperactivity disorder, unspecified type: Secondary | ICD-10-CM

## 2018-04-11 MED ORDER — AMPHETAMINE-DEXTROAMPHETAMINE 20 MG PO TABS: 20.0000 mg | ORAL_TABLET | Freq: Two times a day (BID) | ORAL | 0 refills | Status: DC

## 2018-04-11 NOTE — Telephone Encounter (Signed)
The 2x a day dose will have to be the immediate release form, not XR like she had been taking

## 2018-04-11 NOTE — Telephone Encounter (Signed)
Called Pt to tell her Leanora CoverLauren Guse FNP sent in a new Rx, also to see if the Pt was able to pick it up. No answer left VM

## 2018-04-12 ENCOUNTER — Telehealth: Payer: Self-pay | Admitting: Lab

## 2018-04-12 NOTE — Telephone Encounter (Signed)
Called Pt to tell her the Rx (Amphetamine-dextroamphetamine) approved and was ready to be picked up at [facility] with a $15.00 charge. No answer left VM will try back later.

## 2018-05-03 ENCOUNTER — Telehealth: Payer: Self-pay | Admitting: Lab

## 2018-05-03 NOTE — Telephone Encounter (Signed)
So a PA is needed for the extended release BID dosing that was originally sent last month.

## 2018-05-03 NOTE — Telephone Encounter (Signed)
See phone encounter from 04/10/2018  There was an issue with the extended release being taken 2 times a day, was not approved by insurance and that is why it was switched to immediate release  So is the XR twice a day dose now approved? If not then the XR dose would be taken 1 time per day or the alterative is to take immediate release 2 times a day

## 2018-05-03 NOTE — Telephone Encounter (Signed)
Pt called office and stated her Rx for the Adderall should be Extended release

## 2018-05-03 NOTE — Telephone Encounter (Signed)
Pt states the Extended release 2x a day she never had a problem with it for years and it should go through. She stated it was not sent in right the last time. Pt also said something about it being before 05/11/2018 it might be too early shes not sure, she just going to call the pharmacy but you can still resend it

## 2018-05-03 NOTE — Telephone Encounter (Signed)
Pt called in and stated that pharamcy will be sending over the PA on this med. Pt ins is requiring a PA .  She would like to know get this process started she can get the refill by the 7th of feb

## 2018-08-24 ENCOUNTER — Ambulatory Visit: Payer: Self-pay

## 2018-08-24 NOTE — Telephone Encounter (Signed)
Wayne Both to call pt to confirm that pt went to [facility].  No answer and unable to leave a vm due to vmbox being full.

## 2018-08-24 NOTE — Telephone Encounter (Signed)
Pt called stating that she fell last night while walking her new puppy.  She has injury to her right side of her nose and jaw She has scraped the palm of her hand and her right wrist is swollen and turning purple.. She states the pain is 5-6.  She is able to open and close her fingers and hold a glass but it hurt to do so. Care advice read to patient. Pt verbalized understanding. Call placed to [facility] for advice/scheduling. Pt will go to [facility] for evaluation of her injuries. Pt requesting note for work. She was told that the urgent care should be able to provide her with a note.  Will route note to office.  Reason for Disposition . Can't use injured hand normally (e.g., make a fist, open fully, hold a glass of water)  Answer Assessment - Initial Assessment Questions 1. ONSET: "When did the swelling start?" (e.g., minutes, hours, days, weeks)     After fall last night 2. LOCATION: "What part of the wrist is swollen?"  "Are both wrists swollen or just one wrist?"     Rt wrist and nose 3. SEVERITY: "How bad is the swelling?"    - BALL OR LUMP: small ball or lump   - SKIN ONLY: localized; puffy or swollen area or patch of skin   - MILD JOINT SWELLING: joint feels or looks mildly swollen or puffy   - MODERATE JOINT SWELLING: moderate joint swelling; looks swollen   - SEVERE JOINT SWELLING:  severe joint swelling; can barely bend or move joint     Moderate with bruising 4. RECURRENT SYMPTOM: "Have you had wrist swelling before?" If so, ask: "When was the last time?" "What happened that time?"    No 5. CAUSE: "What do you think is causing the wrist swelling?" (e.g., arthritis, ganglion cyst, insect bite, recent injury)     fall 6. OTHER SYMPTOMS: "Do you have any other symptoms?" (e.g., fever, hand pain)    Pain level 5 7. PREGNANCY: "Is there any chance you are pregnant?" "When was your last menstrual period?"    No 1.5 weeks ago  Answer Assessment - Initial Assessment Questions 1.  MECHANISM: "How did the injury happen?"     fall 2. ONSET: "When did the injury happen?" (Minutes or hours ago)     Last night 3. APPEARANCE of INJURY: "What does the injury look like?"      Swollen blue 4. SEVERITY: "Can you use the hand normally?" "Can you bend your fingers into a ball and then fully open them?"    yes 5. SIZE: For cuts, bruises, or swelling, ask: "How large is it?" (e.g., inches or centimeters;  entire hand or wrist)      None scrape on palm of hand 6. PAIN: "Is there pain?" If so, ask: "How bad is the pain?"  (Scale 1-10; or mild, moderate, severe)     5-6 7. TETANUS: For any breaks in the skin, ask: "When was the last tetanus booster?"    3 years ago 8. OTHER SYMPTOMS: "Do you have any other symptoms?"      Nose and jaw are bleeding 9. PREGNANCY: "Is there any chance you are pregnant?" "When was your last menstrual period?"    No  Protocols used: HAND AND WRIST INJURY-A-AH, WRIST [facility]

## 2018-08-24 NOTE — Telephone Encounter (Signed)
Keep calling pt  May call emergency contacts as well

## 2018-08-24 NOTE — Telephone Encounter (Signed)
Called pt again.  No answer.  Vmbox was full.  Also called pt's father at number listed in chart.  No answer.

## 2018-08-29 NOTE — Telephone Encounter (Signed)
Mail letter to effect that we have been trying to reach patient. Advise her to call the office if she needs anything

## 2018-10-01 ENCOUNTER — Ambulatory Visit (INDEPENDENT_AMBULATORY_CARE_PROVIDER_SITE_OTHER): Payer: 59 | Admitting: Family Medicine

## 2018-10-01 ENCOUNTER — Ambulatory Visit (INDEPENDENT_AMBULATORY_CARE_PROVIDER_SITE_OTHER): Payer: 59

## 2018-10-01 ENCOUNTER — Telehealth: Payer: Self-pay | Admitting: Family Medicine

## 2018-10-01 ENCOUNTER — Encounter: Payer: Self-pay | Admitting: Family Medicine

## 2018-10-01 ENCOUNTER — Other Ambulatory Visit: Payer: Self-pay

## 2018-10-01 DIAGNOSIS — Y990 Civilian activity done for income or pay: Secondary | ICD-10-CM

## 2018-10-01 DIAGNOSIS — S6991XA Unspecified injury of right wrist, hand and finger(s), initial encounter: Secondary | ICD-10-CM

## 2018-10-01 DIAGNOSIS — Z3200 Encounter for pregnancy test, result unknown: Secondary | ICD-10-CM | POA: Diagnosis not present

## 2018-10-01 DIAGNOSIS — T148XXA Other injury of unspecified body region, initial encounter: Secondary | ICD-10-CM

## 2018-10-01 NOTE — Telephone Encounter (Signed)
Also ---- this is a work injury  [place]

## 2018-10-01 NOTE — Telephone Encounter (Signed)
Called Pt No answer No VM will try back later 

## 2018-10-01 NOTE — Telephone Encounter (Signed)
OK - thanks

## 2018-10-01 NOTE — Telephone Encounter (Signed)
Pt scheduled today at 3:15pm for X-ray
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
## 2018-10-01 NOTE — Telephone Encounter (Signed)
If she needs xray --- would be easier if she came in rather than doxy and then doing xray later  Can this be changed to in office? As long as she passes covid questions and wears mask, can come in  Also this is a work injury so please be sure its under [facility] insurance  Thanks!  LG

## 2018-10-01 NOTE — Telephone Encounter (Signed)
Please call to get her on xray schedule today preferred  Thanks  LG

## 2018-10-02 ENCOUNTER — Telehealth: Payer: Self-pay | Admitting: Family Medicine

## 2018-10-02 ENCOUNTER — Other Ambulatory Visit: Payer: Self-pay | Admitting: Family Medicine

## 2018-10-02 DIAGNOSIS — Y990 Civilian activity done for income or pay: Secondary | ICD-10-CM

## 2018-10-02 DIAGNOSIS — S6991XA Unspecified injury of right wrist, hand and finger(s), initial encounter: Secondary | ICD-10-CM

## 2018-10-02 NOTE — Progress Notes (Signed)
Orthopedic referral.

## 2018-10-02 NOTE — Telephone Encounter (Signed)
Pt was called today and informed of her X-ray results

## 2018-10-02 NOTE — Telephone Encounter (Signed)
General/Other - Lab Results  The patient was told that her lab results would be back the next day so she is calling for them. The patient needs the results to be able to return to work. The lab results have not be resulted yet so the nurse at the [facility] can not give them out. Please call the patient back.

## 2018-10-09 NOTE — Telephone Encounter (Signed)
Mailed letter °

## 2019-07-04 ENCOUNTER — Encounter: Payer: Self-pay | Admitting: Obstetrics and Gynecology

## 2019-07-19 ENCOUNTER — Telehealth: Payer: Self-pay | Admitting: Family Medicine

## 2019-07-19 NOTE — Telephone Encounter (Signed)
Yes, I agree with this triage reply.
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
## 2019-07-23 ENCOUNTER — Telehealth: Payer: 59 | Admitting: Nurse Practitioner

## 2019-07-23 ENCOUNTER — Encounter: Payer: Self-pay | Admitting: Nurse Practitioner

## 2019-07-23 VITALS — Ht 63.0 in | Wt 128.0 lb

## 2019-07-23 DIAGNOSIS — Z8659 Personal history of other mental and behavioral disorders: Secondary | ICD-10-CM

## 2019-07-23 DIAGNOSIS — F4323 Adjustment disorder with mixed anxiety and depressed mood: Secondary | ICD-10-CM

## 2019-07-23 DIAGNOSIS — Z3044 Encounter for surveillance of vaginal ring hormonal contraceptive device: Secondary | ICD-10-CM

## 2019-07-23 NOTE — Progress Notes (Addendum)
Virtual Visit via Video Note  I connected with@ on 07/23/19 at  1:00 PM EDT by a video enabled telemedicine application and verified that I am speaking with the correct person using two identifiers. This visit type was conducted due to national recommendations for restrictions regarding the COVID-19 Pandemic (e.g. social distancing).  This format is felt to be most appropriate for this patient at this time.   I discussed the limitations of evaluation and management by telemedicine and the availability of in person appointments. The patient expressed understanding and agreed to proceed.  Only the patient and myself were on today's video visit. The patient was at home and I was in my office at the time of today's visit.   History of Present Illness: This 24 year old presents for tele visit to have refills on NuvaRing birth control.  She has been out of the NuvaRing for 3 weeks.  She denies any problems with the contraception.  She reports no exposure for risk of pregnancy.  She has no vaginal complaints.  Her last Pap test was 2019 -results not visible in the system.  She is also requesting refills of her Adderall. She has been taking Adderall since about age 40 and recalls a detailed ADHD psychiatric evaluation ordered by Dr. Sherren Mocha.  She has been a longtime patient of Dr. Sherren Mocha before he retired.  She has been off of Adderall 20 mg twice a day since early 2020. She has done well off of it.  She plans to go back to school and thinks Adderall helps her do better in school. She wants to have a different dosing because she does not think she needs a second dose.  Her symptoms include sometimes she forgets something at work when [employer], or she might start to do something to get distracted and do something else. Her problem list also includes adjustment disorder with anxiety and depression.  She reports she is doing fine and denies anxiety and depression at this time.  Observations/Objective: Gen: Awake,  alert, no acute distress Resp: Breathing is even and non-labored Psych: calm/pleasant demeanor Neuro: Alert and Oriented x 3, + facial symmetry, speech is clear.  Assessment and Plan: Encounter for surveillance of vaginal ring hormonal contraceptive device: She requested to get a urine pregnancy since she has been off birth control for 3 weeks.  She is advised to make an appt for pelvic exam and PAP test.  She has a Covid test pending exposure to somebody at work but she has had no Covid symptoms. Addendum: Urine  pregnancy is negative and I refilled her Nuvaring  Patient is requesting refills of Adderall for history of ADD.  She plans to go back to school and thinks she needs to restart the medication once a day. She has been off the medication for over a year and by her description of her symptoms, I am not sure that this is the best medication for her or that she still requires the medication.  I would like her to see psychiatry for evaluation.  She also has a history of anxiety depression which she reports no current concerns.   I discussed the assessment and treatment plan with the patient. The patient was provided an opportunity to ask questions and all were answered. The patient agreed with the plan and demonstrated an understanding of the instructions.   The patient was advised to call back or seek an in-person evaluation if the symptoms worsen or if the condition fails to improve as anticipated.  Amedeo Kinsman, NP

## 2019-07-23 NOTE — Patient Instructions (Addendum)
It was nice to meet you today being via the virtual video.  I have put in a referral for psychiatry.  Await urine pregnancy and if negative we will refill your vaginal ring hormonal contraceptive device.    Please make an office visit for a complete physical exam and Pap test.  I do not see the Pap test result in our epic system.

## 2019-07-24 LAB — PREGNANCY, URINE: Preg Test, Ur: NEGATIVE

## 2019-07-24 MED ORDER — ETONOGESTREL-ETHINYL ESTRADIOL 0.12-0.015 MG/24HR VA RING: VAGINAL_RING | VAGINAL | 3 refills | Status: DC

## 2019-07-24 NOTE — Addendum Note (Signed)
Addended by: Amedeo Kinsman A on: 07/24/2019 05:36 PM   Modules accepted: Orders

## 2019-07-30 NOTE — Telephone Encounter (Signed)
Patient did not answer and there is no voicemail to leave message. Will try again tomorrow.

## 2019-07-31 NOTE — Telephone Encounter (Signed)
Patient called again; no answer and no voicemail to leave message. Will send mychart message to patient as well.

## 2019-07-31 NOTE — Telephone Encounter (Signed)
Correction- patient does not have mychart. Spoke with Maralyn Sago and she did speak with patient about prescription.

## 2019-09-04 ENCOUNTER — Telehealth: Payer: Self-pay | Admitting: Nurse Practitioner

## 2019-09-04 NOTE — Telephone Encounter (Signed)
Please call her and let her know Psychiatry has been trying to reach to discuss her Adderall.

## 2019-09-05 NOTE — Telephone Encounter (Signed)
Spoke with patient and she was unaware psychiatry was unable to contact her. Patient was unable to talk long and stated she will call back for details at a later time.

## 2019-09-10 NOTE — Telephone Encounter (Signed)
Tried to touch base with patient again about psy referral but no answer and could not leave a message.

## 2019-09-13 NOTE — Telephone Encounter (Signed)
Tried to call patient again but NANM. Sent patient a letter to call office

## 2019-11-19 ENCOUNTER — Telehealth: Payer: Self-pay | Admitting: Nurse Practitioner

## 2019-11-19 NOTE — Telephone Encounter (Signed)
She damaged her nuvaring for 3 weeks- is that it? And, she can get a new one in 3 weeks and start over? I recommend abstinence for 3 weeks.   Or, does she want to make a office visit to change to oral contraception?

## 2019-11-19 NOTE — Telephone Encounter (Signed)
error 
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
## 2019-11-19 NOTE — Telephone Encounter (Signed)
Not really sure what we can do about this?? Is there something else that can be sent in for her?

## 2019-11-20 NOTE — Telephone Encounter (Signed)
Tried to call patient but no answer and could not leave a message

## 2019-11-21 NOTE — Telephone Encounter (Signed)
Tried to speak with patient but she could not talk and stated she would call back later

## 2019-11-27 NOTE — Telephone Encounter (Signed)
Tried to call but could not leave message

## 2020-04-10 ENCOUNTER — Other Ambulatory Visit: Payer: Self-pay

## 2020-04-10 ENCOUNTER — Other Ambulatory Visit: Payer: 59

## 2020-04-10 DIAGNOSIS — Z20822 Contact with and (suspected) exposure to covid-19: Secondary | ICD-10-CM

## 2020-04-14 LAB — NOVEL CORONAVIRUS, NAA: SARS-CoV-2, NAA: NOT DETECTED

## 2020-11-29 ENCOUNTER — Emergency Department: Payer: No Typology Code available for payment source

## 2020-11-29 ENCOUNTER — Emergency Department
Admission: EM | Admit: 2020-11-29 | Discharge: 2020-11-29 | Disposition: A | Discharge status: Home / Self Care | Payer: No Typology Code available for payment source | Attending: Emergency Medicine | Admitting: Emergency Medicine

## 2020-11-29 ENCOUNTER — Other Ambulatory Visit: Payer: Self-pay

## 2020-11-29 DIAGNOSIS — E8729 Other acidosis: Secondary | ICD-10-CM

## 2020-11-29 DIAGNOSIS — R531 Weakness: Secondary | ICD-10-CM | POA: Diagnosis not present

## 2020-11-29 DIAGNOSIS — E872 Acidosis: Secondary | ICD-10-CM | POA: Insufficient documentation

## 2020-11-29 DIAGNOSIS — R1084 Generalized abdominal pain: Secondary | ICD-10-CM | POA: Insufficient documentation

## 2020-11-29 DIAGNOSIS — R112 Nausea with vomiting, unspecified: Secondary | ICD-10-CM | POA: Diagnosis not present

## 2020-11-29 DIAGNOSIS — Z87891 Personal history of nicotine dependence: Secondary | ICD-10-CM | POA: Insufficient documentation

## 2020-11-29 DIAGNOSIS — F10988 Alcohol use, unspecified with other alcohol-induced disorder: Secondary | ICD-10-CM | POA: Insufficient documentation

## 2020-11-29 DIAGNOSIS — Z20822 Contact with and (suspected) exposure to covid-19: Secondary | ICD-10-CM | POA: Insufficient documentation

## 2020-11-29 LAB — BASIC METABOLIC PANEL
Anion gap: 14 (ref 5–15)
BUN: <5 mg/dL — ABNORMAL LOW (ref 6–20)
CO2: 16 mmol/L — ABNORMAL LOW (ref 22–32)
Calcium: 8.3 mg/dL — ABNORMAL LOW (ref 8.9–10.3)
Chloride: 102 mmol/L (ref 98–111)
Creatinine, Ser: 0.72 mg/dL (ref 0.44–1.00)
GFR, Estimated: >60 mL/min (ref >60)
Glucose, Bld: 86 mg/dL (ref 70–99)
Potassium: 3.6 mmol/L (ref 3.5–5.1)
Sodium: 132 mmol/L — ABNORMAL LOW (ref 135–145)

## 2020-11-29 LAB — RESP PANEL BY RT-PCR (FLU A&B, COVID) ARPGX2
Influenza A by PCR: NEGATIVE
Influenza B by PCR: NEGATIVE
SARS Coronavirus 2 by RT PCR: NEGATIVE

## 2020-11-29 LAB — CBC
HCT: 42 % (ref 36.0–46.0)
Hemoglobin: 14.5 g/dL (ref 12.0–15.0)
MCH: 32.7 pg (ref 26.0–34.0)
MCHC: 34.5 g/dL (ref 30.0–36.0)
MCV: 94.8 fL (ref 80.0–100.0)
Platelets: 290 10*3/uL (ref 150–400)
RBC: 4.43 MIL/uL (ref 3.87–5.11)
RDW: 13.4 % (ref 11.5–15.5)
WBC: 10.7 10*3/uL — ABNORMAL HIGH (ref 4.0–10.5)
nRBC: 0 % (ref 0.0–0.2)

## 2020-11-29 LAB — HEPATIC FUNCTION PANEL
ALT: 23 U/L (ref 0–44)
AST: 41 U/L (ref 15–41)
Albumin: 4 g/dL (ref 3.5–5.0)
Alkaline Phosphatase: 60 U/L (ref 38–126)
Bilirubin, Direct: 0.1 mg/dL (ref 0.0–0.2)
Indirect Bilirubin: 1.8 mg/dL — ABNORMAL HIGH (ref 0.3–0.9)
Total Bilirubin: 1.9 mg/dL — ABNORMAL HIGH (ref 0.3–1.2)
Total Protein: 7 g/dL (ref 6.5–8.1)

## 2020-11-29 LAB — LIPASE, BLOOD: Lipase: 26 U/L (ref 11–51)

## 2020-11-29 LAB — TROPONIN I (HIGH SENSITIVITY): Troponin I (High Sensitivity): 10 ng/L (ref <18)

## 2020-11-29 MED ORDER — ONDANSETRON 4 MG PO TBDP: 4.0000 mg | ORAL_TABLET | Freq: Three times a day (TID) | ORAL | 0 refills | Status: DC | PRN

## 2020-11-29 MED ORDER — SODIUM CHLORIDE 0.9 % IV BOLUS: 1000.0000 mL | Freq: Once | INTRAVENOUS | Status: DC

## 2020-11-29 MED ORDER — LORAZEPAM 2 MG/ML IJ SOLN
1.0000 mg | Freq: Once | INTRAMUSCULAR | Status: AC
  Administered 2020-11-29: 1 mg via INTRAVENOUS
  Filled 2020-11-29: qty 1

## 2020-11-29 MED ORDER — ALUM & MAG HYDROXIDE-SIMETH 200-200-20 MG/5ML PO SUSP
30.0000 mL | Freq: Once | ORAL | Status: AC
  Administered 2020-11-29: 30 mL via ORAL
  Filled 2020-11-29: qty 30

## 2020-11-29 MED ORDER — SODIUM CHLORIDE 0.9 % IV BOLUS
1000.0000 mL | Freq: Once | INTRAVENOUS | Status: AC
  Administered 2020-11-29: 1000 mL via INTRAVENOUS

## 2020-11-29 MED ORDER — LACTATED RINGERS IV BOLUS
2000.0000 mL | Freq: Once | INTRAVENOUS | Status: AC
  Administered 2020-11-29: 2000 mL via INTRAVENOUS

## 2020-11-29 MED ORDER — ONDANSETRON HCL 4 MG/2ML IJ SOLN
4.0000 mg | Freq: Once | INTRAMUSCULAR | Status: AC
  Administered 2020-11-29: 4 mg via INTRAVENOUS
  Filled 2020-11-29: qty 2

## 2020-11-29 NOTE — ED Notes (Signed)
Dr. Paduchowski MD at bedside at this time.  
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
## 2020-11-29 NOTE — ED Notes (Signed)
Care transferred, report received from Ashley, RN 

## 2020-11-29 NOTE — Discharge Instructions (Addendum)
Please take your nausea medication as prescribed, as needed.  Please drink plenty of fluids.  Return to the emergency department if you begin vomiting unable to keep down fluids if your abdominal pain worsens, or any other symptom personally concerning to yourself.

## 2020-11-29 NOTE — ED Triage Notes (Signed)
Pt comes pov with not feeling well, vomiting dark vomit last night. Pt has been very stressed lately and hasn't been able to eat due to best friend dying. Chest pain cramping in center of her chest. Abd pain from nausea and doesn't hurt at this time.

## 2020-11-30 LAB — BASIC METABOLIC PANEL
Anion gap: 27 — ABNORMAL HIGH (ref 5–15)
BUN: <5 mg/dL — ABNORMAL LOW (ref 6–20)
CO2: 8 mmol/L — ABNORMAL LOW (ref 22–32)
Calcium: 8.9 mg/dL (ref 8.9–10.3)
Chloride: 98 mmol/L (ref 98–111)
Creatinine, Ser: 0.9 mg/dL (ref 0.44–1.00)
GFR, Estimated: >60 mL/min (ref >60)
Glucose, Bld: 101 mg/dL — ABNORMAL HIGH (ref 70–99)
Potassium: 3.8 mmol/L (ref 3.5–5.1)
Sodium: 133 mmol/L — ABNORMAL LOW (ref 135–145)

## 2022-05-05 DIAGNOSIS — Z681 Body mass index (BMI) 19 or less, adult: Secondary | ICD-10-CM | POA: Diagnosis not present

## 2022-05-05 DIAGNOSIS — N925 Other specified irregular menstruation: Secondary | ICD-10-CM | POA: Diagnosis not present

## 2022-05-05 DIAGNOSIS — Z3202 Encounter for pregnancy test, result negative: Secondary | ICD-10-CM | POA: Diagnosis not present

## 2022-07-14 DIAGNOSIS — F902 Attention-deficit hyperactivity disorder, combined type: Secondary | ICD-10-CM | POA: Diagnosis not present

## 2022-08-12 DIAGNOSIS — F902 Attention-deficit hyperactivity disorder, combined type: Secondary | ICD-10-CM | POA: Diagnosis not present

## 2022-09-09 DIAGNOSIS — F902 Attention-deficit hyperactivity disorder, combined type: Secondary | ICD-10-CM | POA: Diagnosis not present

## 2022-10-10 DIAGNOSIS — F902 Attention-deficit hyperactivity disorder, combined type: Secondary | ICD-10-CM | POA: Diagnosis not present

## 2022-11-10 DIAGNOSIS — F902 Attention-deficit hyperactivity disorder, combined type: Secondary | ICD-10-CM | POA: Diagnosis not present

## 2022-11-26 IMAGING — CR DG CHEST 2V
1 series · 2 of 2 positions shown · non-contrast
Comparison: None.

CLINICAL DATA: Chest pain.  Vomiting last night.

EXAM:
CHEST - 2 VIEW

[Series 1: dg chest 2 view · 0.14mm/px · 2 of 2 slices shown]
[im 1/2]
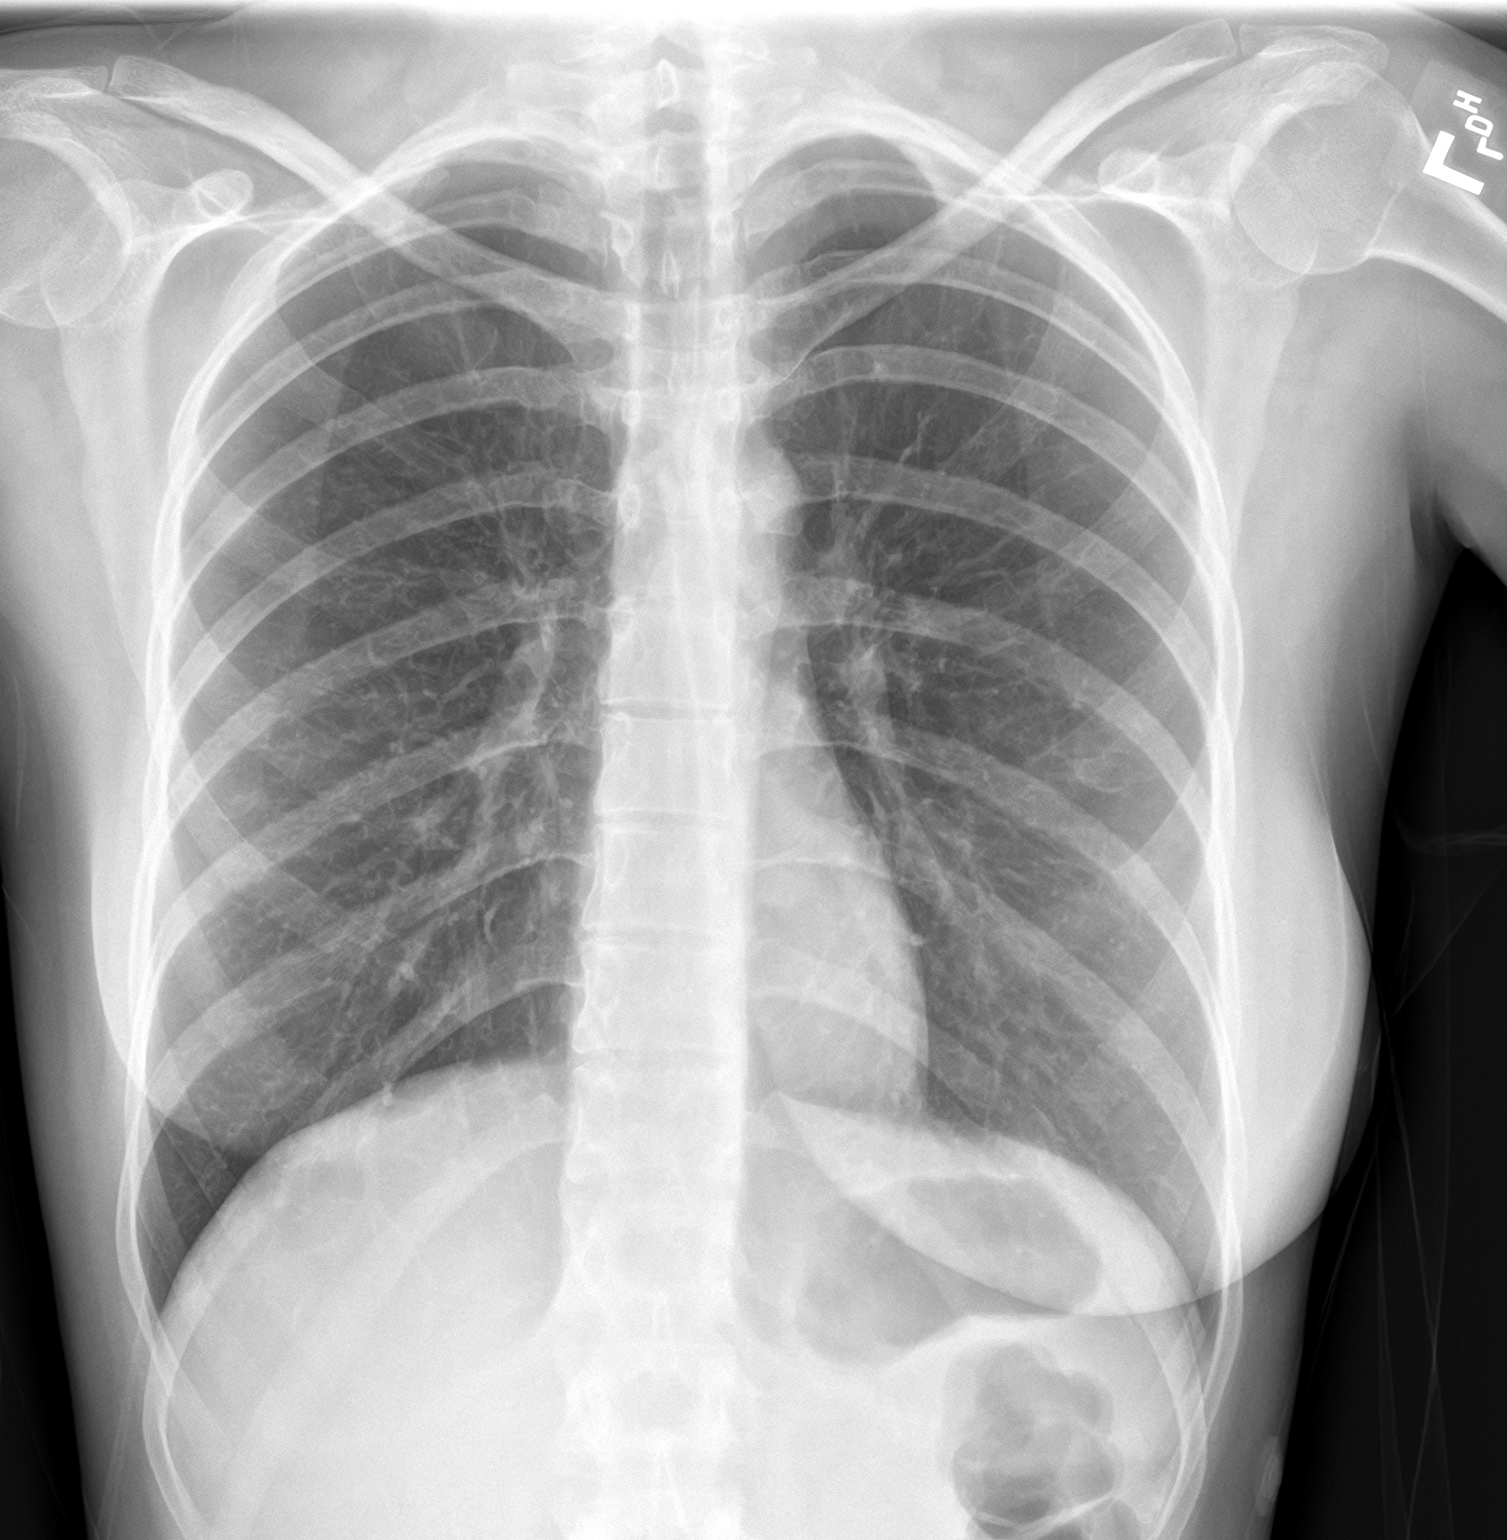
[im 2/2]
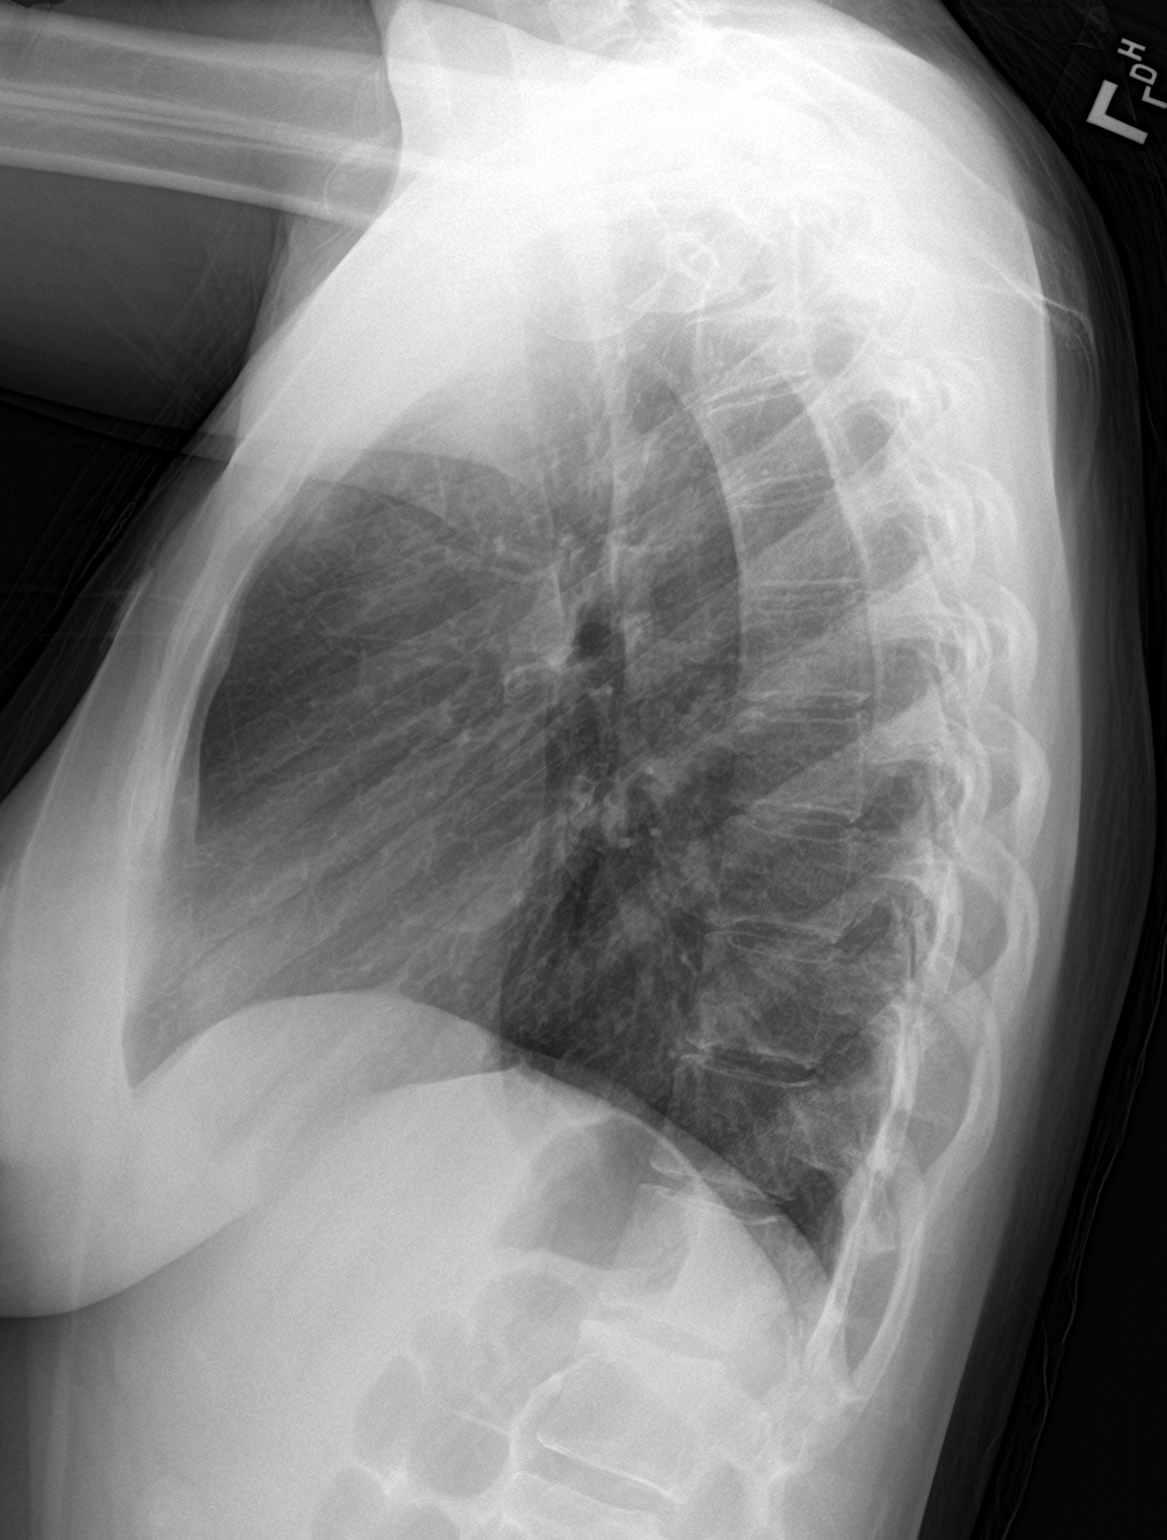

[2 of 2 positions shown; findings below may reference images not displayed]

FINDINGS: The heart size and mediastinal contours are within normal limits.
Both lungs are clear. The visualized skeletal structures are
unremarkable.
IMPRESSION: No active cardiopulmonary disease.

## 2022-12-07 DIAGNOSIS — F902 Attention-deficit hyperactivity disorder, combined type: Secondary | ICD-10-CM | POA: Diagnosis not present

## 2022-12-07 DIAGNOSIS — F330 Major depressive disorder, recurrent, mild: Secondary | ICD-10-CM | POA: Diagnosis not present

## 2022-12-26 DIAGNOSIS — Z833 Family history of diabetes mellitus: Secondary | ICD-10-CM | POA: Diagnosis not present

## 2022-12-26 DIAGNOSIS — F1520 Other stimulant dependence, uncomplicated: Secondary | ICD-10-CM | POA: Diagnosis not present

## 2022-12-26 DIAGNOSIS — Z793 Long term (current) use of hormonal contraceptives: Secondary | ICD-10-CM | POA: Diagnosis not present

## 2022-12-26 DIAGNOSIS — Z880 Allergy status to penicillin: Secondary | ICD-10-CM | POA: Diagnosis not present

## 2022-12-26 DIAGNOSIS — F909 Attention-deficit hyperactivity disorder, unspecified type: Secondary | ICD-10-CM | POA: Diagnosis not present

## 2022-12-26 DIAGNOSIS — F325 Major depressive disorder, single episode, in full remission: Secondary | ICD-10-CM | POA: Diagnosis not present

## 2022-12-26 DIAGNOSIS — Z809 Family history of malignant neoplasm, unspecified: Secondary | ICD-10-CM | POA: Diagnosis not present

## 2022-12-26 DIAGNOSIS — Z8249 Family history of ischemic heart disease and other diseases of the circulatory system: Secondary | ICD-10-CM | POA: Diagnosis not present

## 2023-01-03 DIAGNOSIS — F902 Attention-deficit hyperactivity disorder, combined type: Secondary | ICD-10-CM | POA: Diagnosis not present

## 2023-01-03 DIAGNOSIS — F330 Major depressive disorder, recurrent, mild: Secondary | ICD-10-CM | POA: Diagnosis not present

## 2023-02-03 DIAGNOSIS — F330 Major depressive disorder, recurrent, mild: Secondary | ICD-10-CM | POA: Diagnosis not present

## 2023-02-03 DIAGNOSIS — F902 Attention-deficit hyperactivity disorder, combined type: Secondary | ICD-10-CM | POA: Diagnosis not present

## 2023-02-03 DIAGNOSIS — F411 Generalized anxiety disorder: Secondary | ICD-10-CM | POA: Diagnosis not present

## 2023-03-07 DIAGNOSIS — F411 Generalized anxiety disorder: Secondary | ICD-10-CM | POA: Diagnosis not present

## 2023-03-07 DIAGNOSIS — F330 Major depressive disorder, recurrent, mild: Secondary | ICD-10-CM | POA: Diagnosis not present

## 2023-03-07 DIAGNOSIS — F902 Attention-deficit hyperactivity disorder, combined type: Secondary | ICD-10-CM | POA: Diagnosis not present

## 2023-04-06 DIAGNOSIS — F411 Generalized anxiety disorder: Secondary | ICD-10-CM | POA: Diagnosis not present

## 2023-04-06 DIAGNOSIS — F902 Attention-deficit hyperactivity disorder, combined type: Secondary | ICD-10-CM | POA: Diagnosis not present

## 2023-04-06 DIAGNOSIS — F330 Major depressive disorder, recurrent, mild: Secondary | ICD-10-CM | POA: Diagnosis not present

## 2023-04-10 DIAGNOSIS — H7292 Unspecified perforation of tympanic membrane, left ear: Secondary | ICD-10-CM | POA: Diagnosis not present

## 2023-04-17 DIAGNOSIS — F411 Generalized anxiety disorder: Secondary | ICD-10-CM | POA: Diagnosis not present

## 2023-04-18 DIAGNOSIS — R41840 Attention and concentration deficit: Secondary | ICD-10-CM | POA: Diagnosis not present

## 2023-04-18 DIAGNOSIS — F3289 Other specified depressive episodes: Secondary | ICD-10-CM | POA: Diagnosis not present

## 2023-04-25 DIAGNOSIS — F411 Generalized anxiety disorder: Secondary | ICD-10-CM | POA: Diagnosis not present

## 2023-05-02 DIAGNOSIS — F411 Generalized anxiety disorder: Secondary | ICD-10-CM | POA: Diagnosis not present

## 2023-05-08 DIAGNOSIS — F330 Major depressive disorder, recurrent, mild: Secondary | ICD-10-CM | POA: Diagnosis not present

## 2023-05-08 DIAGNOSIS — F902 Attention-deficit hyperactivity disorder, combined type: Secondary | ICD-10-CM | POA: Diagnosis not present

## 2023-05-08 DIAGNOSIS — F5101 Primary insomnia: Secondary | ICD-10-CM | POA: Diagnosis not present

## 2023-05-08 DIAGNOSIS — F411 Generalized anxiety disorder: Secondary | ICD-10-CM | POA: Diagnosis not present

## 2023-05-09 DIAGNOSIS — F411 Generalized anxiety disorder: Secondary | ICD-10-CM | POA: Diagnosis not present

## 2023-05-15 DIAGNOSIS — F411 Generalized anxiety disorder: Secondary | ICD-10-CM | POA: Diagnosis not present

## 2023-05-24 DIAGNOSIS — F411 Generalized anxiety disorder: Secondary | ICD-10-CM | POA: Diagnosis not present

## 2023-06-01 DIAGNOSIS — F411 Generalized anxiety disorder: Secondary | ICD-10-CM | POA: Diagnosis not present

## 2023-06-05 DIAGNOSIS — F902 Attention-deficit hyperactivity disorder, combined type: Secondary | ICD-10-CM | POA: Diagnosis not present

## 2023-06-05 DIAGNOSIS — F411 Generalized anxiety disorder: Secondary | ICD-10-CM | POA: Diagnosis not present

## 2023-06-05 DIAGNOSIS — F5101 Primary insomnia: Secondary | ICD-10-CM | POA: Diagnosis not present

## 2023-06-05 DIAGNOSIS — F330 Major depressive disorder, recurrent, mild: Secondary | ICD-10-CM | POA: Diagnosis not present

## 2023-06-07 DIAGNOSIS — F411 Generalized anxiety disorder: Secondary | ICD-10-CM | POA: Diagnosis not present

## 2023-06-13 DIAGNOSIS — F411 Generalized anxiety disorder: Secondary | ICD-10-CM | POA: Diagnosis not present

## 2023-06-30 DIAGNOSIS — F411 Generalized anxiety disorder: Secondary | ICD-10-CM | POA: Diagnosis not present

## 2023-07-04 DIAGNOSIS — F411 Generalized anxiety disorder: Secondary | ICD-10-CM | POA: Diagnosis not present

## 2023-07-31 DIAGNOSIS — F411 Generalized anxiety disorder: Secondary | ICD-10-CM | POA: Diagnosis not present

## 2023-08-02 DIAGNOSIS — F411 Generalized anxiety disorder: Secondary | ICD-10-CM | POA: Diagnosis not present

## 2023-08-15 DIAGNOSIS — F411 Generalized anxiety disorder: Secondary | ICD-10-CM | POA: Diagnosis not present

## 2023-08-22 DIAGNOSIS — F411 Generalized anxiety disorder: Secondary | ICD-10-CM | POA: Diagnosis not present

## 2023-09-12 DIAGNOSIS — S8392XA Sprain of unspecified site of left knee, initial encounter: Secondary | ICD-10-CM | POA: Diagnosis not present

## 2023-09-15 DIAGNOSIS — S8992XA Unspecified injury of left lower leg, initial encounter: Secondary | ICD-10-CM | POA: Diagnosis not present

## 2023-09-19 DIAGNOSIS — F411 Generalized anxiety disorder: Secondary | ICD-10-CM | POA: Diagnosis not present

## 2023-09-21 DIAGNOSIS — M25562 Pain in left knee: Secondary | ICD-10-CM | POA: Diagnosis not present

## 2023-09-27 DIAGNOSIS — M25562 Pain in left knee: Secondary | ICD-10-CM | POA: Diagnosis not present
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
## 2023-09-29 DIAGNOSIS — F411 Generalized anxiety disorder: Secondary | ICD-10-CM | POA: Diagnosis not present

## 2023-10-02 ENCOUNTER — Encounter (HOSPITAL_COMMUNITY)
Admission: RE | Admit: 2023-10-02 | Discharge: 2023-10-02 | Disposition: A | Discharge status: Home / Self Care | Source: Ambulatory Visit | Attending: Orthopedic Surgery | Admitting: Orthopedic Surgery

## 2023-10-02 ENCOUNTER — Encounter (HOSPITAL_COMMUNITY): Payer: Self-pay

## 2023-10-02 ENCOUNTER — Other Ambulatory Visit: Payer: Self-pay

## 2023-10-02 VITALS — Ht 62.0 in | Wt 109.0 lb

## 2023-10-02 DIAGNOSIS — X58XXXA Exposure to other specified factors, initial encounter: Secondary | ICD-10-CM | POA: Diagnosis not present

## 2023-10-02 DIAGNOSIS — Z01818 Encounter for other preprocedural examination: Secondary | ICD-10-CM

## 2023-10-02 DIAGNOSIS — S83282A Other tear of lateral meniscus, current injury, left knee, initial encounter: Secondary | ICD-10-CM | POA: Diagnosis present

## 2023-10-02 DIAGNOSIS — F17290 Nicotine dependence, other tobacco product, uncomplicated: Secondary | ICD-10-CM | POA: Diagnosis not present

## 2023-10-02 NOTE — Anesthesia Preprocedure Evaluation (Signed)
 Anesthesia Evaluation  Patient identified by MRN, date of birth, ID band Patient awake    Reviewed: Allergy & Precautions, NPO status , Patient's Chart, lab work & pertinent test results  History of Anesthesia Complications (+) Emergence Delirium and history of anesthetic complications  Airway Mallampati: I  TM Distance: >3 FB Neck ROM: Full    Dental  (+) Teeth Intact, Dental Advisory Given   Pulmonary former smoker   Pulmonary exam normal breath sounds clear to auscultation       Cardiovascular negative cardio ROS Normal cardiovascular exam Rhythm:Regular Rate:Normal     Neuro/Psych  Headaches PSYCHIATRIC DISORDERS Anxiety Depression       GI/Hepatic negative GI ROS,,,(+)       marijuana use  Endo/Other  negative endocrine ROS    Renal/GU negative Renal ROS  negative genitourinary   Musculoskeletal negative musculoskeletal ROS (+)    Abdominal   Peds  Hematology negative hematology ROS (+)   Anesthesia Other Findings   Reproductive/Obstetrics Urine preg neg                             Anesthesia Physical Anesthesia Plan  ASA: 1  Anesthesia Plan: General   Post-op Pain Management: Tylenol  PO (pre-op)*, Toradol  IV (intra-op)* and Precedex   Induction: Intravenous  PONV Risk Score and Plan: 3 and Ondansetron , Dexamethasone , Midazolam  and Treatment may vary due to age or medical condition  Airway Management Planned: LMA  Additional Equipment: None  Intra-op Plan:   Post-operative Plan: Extubation in OR  Informed Consent: I have reviewed the patients History and Physical, chart, labs and discussed the procedure including the risks, benefits and alternatives for the proposed anesthesia with the patient or authorized representative who has indicated his/her understanding and acceptance.     Dental advisory given  Plan Discussed with: CRNA  Anesthesia Plan Comments:  (Very anxious in preop- requesting anxiolysis)       Anesthesia Quick Evaluation

## 2023-10-02 NOTE — Progress Notes (Addendum)
 Anesthesia Review:  PCP: DR Papsis- [facility] out of [facility] per pt  Cardiologist : none   PPM/ ICD: Device Orders: Rep Notified:  Chest x-ray : EKG : Echo : Stress test: Cardiac Cath :   Activity level: can do a flight of stairs without difficulty  Sleep Study/ CPAP : none  Fasting Blood Sugar :      / Checks Blood Sugar -- times a day:    Blood Thinner/ Instructions /Last Dose: ASA / Instructions/ Last Dose :    Phone call appt on 10/02/23.  Med hx and preop instructons completed.  PT voiced understanding.  At end of preop phone call pt asked to call Admitting at [phone].    PT uses THC in vape.

## 2023-10-02 NOTE — Patient Instructions (Addendum)
 SURGICAL WAITING ROOM VISITATION  Patients having surgery or a procedure may have no more than 2 support people in the waiting area - these visitors may rotate.    Children under the age of 34 must have an adult with them who is not the patient.  Visitors with respiratory illnesses are discouraged from visiting and should remain at home.  If the patient needs to stay at the hospital during part of their recovery, the visitor guidelines for inpatient rooms apply. Pre-op nurse will coordinate an appropriate time for 1 support person to accompany patient in pre-op.  This support person may not rotate.    Please refer to the [facility] website for the visitor guidelines for Inpatients (after your surgery is over and you are in a regular room).       Your procedure is scheduled on:  10/03/2023    Report to [facility] Main Entrance    Report to admitting at   1045AM   Call this number if you have problems the morning of surgery [phone]   Do not eat food :After Midnight.   After Midnight you may have the following liquids until __ 1000____ AM DAY OF SURGERY  Water Non-Citrus Juices (without pulp, NO RED-Apple, White grape, White cranberry) Black Coffee (NO MILK/CREAM OR CREAMERS, sugar ok)  Clear Tea (NO MILK/CREAM OR CREAMERS, sugar ok) regular and decaf                             Plain Jell-O (NO RED)                                           Fruit ices (not with fruit pulp, NO RED)                                     Popsicles (NO RED)                                                               Sports drinks like Gatorade (NO RED)                            If you have questions, please contact your surgeon's office.       Oral Hygiene is also important to reduce your risk of infection.                                    Remember - BRUSH YOUR TEETH THE MORNING OF SURGERY WITH YOUR REGULAR TOOTHPASTE  DENTURES WILL BE REMOVED PRIOR TO SURGERY PLEASE DO NOT  APPLY Poly grip OR ADHESIVES!!!   Do NOT smoke after Midnight   Stop all vitamins and herbal supplements 7 days before surgery.   Take these medicines the morning of surgery with A SIP OF WATER: prozac    DO NOT TAKE ANY ORAL DIABETIC MEDICATIONS DAY OF YOUR SURGERY  Bring CPAP mask and tubing day of  surgery.                              You may not have any metal on your body including hair pins, jewelry, and body piercing             Do not wear make-up, lotions, powders, perfumes/cologne, or deodorant  Do not wear nail polish including gel and S&S, artificial/acrylic nails, or any other type of covering on natural nails including finger and toenails. If you have artificial nails, gel coating, etc. that needs to be removed by a nail salon please have this removed prior to surgery or surgery may need to be canceled/ delayed if the surgeon/ anesthesia feels like they are unable to be safely monitored.   Do not shave  48 hours prior to surgery.               Men may shave face and neck.   Do not bring valuables to the hospital. [facility] IS NOT             RESPONSIBLE   FOR VALUABLES.   Contacts, glasses, dentures or bridgework may not be worn into surgery.   Bring small overnight bag day of surgery.   DO NOT BRING YOUR HOME MEDICATIONS TO THE HOSPITAL. PHARMACY WILL DISPENSE MEDICATIONS LISTED ON YOUR MEDICATION LIST TO YOU DURING YOUR ADMISSION IN THE HOSPITAL!    Patients discharged on the day of surgery will not be allowed to drive home.  Someone NEEDS to stay with you for the first 24 hours after anesthesia.   Special Instructions: Bring a copy of your healthcare power of attorney and living will documents the day of surgery if you haven't scanned them before.              Please read over the following fact sheets you were given: IF YOU HAVE QUESTIONS ABOUT YOUR PRE-OP INSTRUCTIONS PLEASE CALL [phone]   If you received a COVID test during your pre-op visit  it is  requested that you wear a mask when out in public, stay away from anyone that may not be feeling well and notify your surgeon if you develop symptoms. If you test positive for Covid or have been in contact with anyone that has tested positive in the last 10 days please notify you surgeon.    [facility] - Preparing for Surgery Before surgery, you can play an important role.  Because skin is not sterile, your skin needs to be as free of germs as possible.  You can reduce the number of germs on your skin by washing with CHG (chlorahexidine gluconate) soap before surgery.  CHG is an antiseptic cleaner which kills germs and bonds with the skin to continue killing germs even after washing. Please DO NOT use if you have an allergy to CHG or antibacterial soaps.  If your skin becomes reddened/irritated stop using the CHG and inform your nurse when you arrive at Short Stay. Do not shave (including legs and underarms) for at least 48 hours prior to the first CHG shower.  You may shave your face/neck. Please follow these instructions carefully:  1.  Shower with CHG Soap the night before surgery and the  morning of Surgery.  2.  If you choose to wash your hair, wash your hair first as usual with your  normal  shampoo.  3.  After you shampoo, rinse your hair and body thoroughly to  remove the  shampoo.                           4.  Use CHG as you would any other liquid soap.  You can apply chg directly  to the skin and wash                       Gently with a scrungie or clean washcloth.  5.  Apply the CHG Soap to your body ONLY FROM THE NECK DOWN.   Do not use on face/ open                           Wound or open sores. Avoid contact with eyes, ears mouth and genitals (private parts).                       Wash face,  Genitals (private parts) with your normal soap.             6.  Wash thoroughly, paying special attention to the area where your surgery  will be performed.  7.  Thoroughly rinse your body with warm  water from the neck down.  8.  DO NOT shower/wash with your normal soap after using and rinsing off  the CHG Soap.                9.  Pat yourself dry with a clean towel.            10.  Wear clean pajamas.            11.  Place clean sheets on your bed the night of your first shower and do not  sleep with pets. Day of Surgery : Do not apply any lotions/deodorants the morning of surgery.  Please wear clean clothes to the hospital/surgery center.  FAILURE TO FOLLOW THESE INSTRUCTIONS MAY RESULT IN THE CANCELLATION OF YOUR SURGERY PATIENT SIGNATURE_________________________________  NURSE SIGNATURE__________________________________  ________________________________________________________________________

## 2023-10-03 ENCOUNTER — Encounter (HOSPITAL_COMMUNITY): Payer: Self-pay | Admitting: Orthopedic Surgery

## 2023-10-03 ENCOUNTER — Encounter (HOSPITAL_COMMUNITY)
Admission: RE | Disposition: A | Discharge status: Home / Self Care | Payer: Self-pay | Source: Home / Self Care | Attending: Orthopedic Surgery

## 2023-10-03 ENCOUNTER — Ambulatory Visit (HOSPITAL_COMMUNITY)
Admission: RE | Admit: 2023-10-03 | Discharge: 2023-10-03 | Disposition: A | Discharge status: Home / Self Care | Attending: Orthopedic Surgery | Admitting: Orthopedic Surgery

## 2023-10-03 ENCOUNTER — Ambulatory Visit (HOSPITAL_BASED_OUTPATIENT_CLINIC_OR_DEPARTMENT_OTHER): Payer: Self-pay | Admitting: Anesthesiology

## 2023-10-03 ENCOUNTER — Ambulatory Visit (HOSPITAL_COMMUNITY): Payer: Self-pay | Admitting: Anesthesiology

## 2023-10-03 DIAGNOSIS — S83282A Other tear of lateral meniscus, current injury, left knee, initial encounter: Secondary | ICD-10-CM

## 2023-10-03 DIAGNOSIS — X58XXXA Exposure to other specified factors, initial encounter: Secondary | ICD-10-CM | POA: Insufficient documentation

## 2023-10-03 DIAGNOSIS — Z9889 Other specified postprocedural states: Secondary | ICD-10-CM

## 2023-10-03 DIAGNOSIS — F17290 Nicotine dependence, other tobacco product, uncomplicated: Secondary | ICD-10-CM | POA: Insufficient documentation

## 2023-10-03 DIAGNOSIS — Z01818 Encounter for other preprocedural examination: Secondary | ICD-10-CM

## 2023-10-03 DIAGNOSIS — M2242 Chondromalacia patellae, left knee: Secondary | ICD-10-CM | POA: Diagnosis not present

## 2023-10-03 LAB — POCT PREGNANCY, URINE: Preg Test, Ur: NEGATIVE

## 2023-10-03 SURGERY — ARTHROSCOPY, KNEE, WITH LATERAL MENISCECTOMY
Anesthesia: General | Site: Knee | Laterality: Left

## 2023-10-03 MED ORDER — MIDAZOLAM HCL 2 MG/2ML IJ SOLN
INTRAMUSCULAR | Status: AC
  Filled 2023-10-03: qty 2

## 2023-10-03 MED ORDER — OXYCODONE HCL 5 MG PO TABS
ORAL_TABLET | ORAL | Status: AC
Start: 2023-10-03 — End: 2023-10-03
  Filled 2023-10-03: qty 1

## 2023-10-03 MED ORDER — AMISULPRIDE (ANTIEMETIC) 5 MG/2ML IV SOLN: 10.0000 mg | Freq: Once | INTRAVENOUS | Status: DC | PRN

## 2023-10-03 MED ORDER — CEFAZOLIN SODIUM-DEXTROSE 2-4 GM/100ML-% IV SOLN
2.0000 g | INTRAVENOUS | Status: AC
  Administered 2023-10-03: 2 g via INTRAVENOUS
  Filled 2023-10-03: qty 100

## 2023-10-03 MED ORDER — MIDAZOLAM HCL 2 MG/2ML IJ SOLN
1.0000 mg | INTRAMUSCULAR | Status: DC | PRN
  Administered 2023-10-03: 1 mg via INTRAVENOUS

## 2023-10-03 MED ORDER — OXYCODONE HCL 5 MG PO TABS
5.0000 mg | ORAL_TABLET | Freq: Once | ORAL | Status: AC | PRN
  Administered 2023-10-03: 5 mg via ORAL

## 2023-10-03 MED ORDER — HYDROMORPHONE HCL 1 MG/ML IJ SOLN
INTRAMUSCULAR | Status: DC | PRN
  Administered 2023-10-03: .4 mg via INTRAVENOUS

## 2023-10-03 MED ORDER — MEPERIDINE HCL 50 MG/ML IJ SOLN: 6.2500 mg | INTRAMUSCULAR | Status: DC | PRN

## 2023-10-03 MED ORDER — HYDROMORPHONE HCL 1 MG/ML IJ SOLN
0.2500 mg | INTRAMUSCULAR | Status: DC | PRN
  Administered 2023-10-03 (×4): 0.5 mg via INTRAVENOUS

## 2023-10-03 MED ORDER — DEXAMETHASONE SODIUM PHOSPHATE 10 MG/ML IJ SOLN
INTRAMUSCULAR | Status: DC | PRN
  Administered 2023-10-03: 5 mg via INTRAVENOUS

## 2023-10-03 MED ORDER — BUPIVACAINE HCL (PF) 0.25 % IJ SOLN
INTRAMUSCULAR | Status: AC
  Filled 2023-10-03: qty 30

## 2023-10-03 MED ORDER — KETOROLAC TROMETHAMINE 15 MG/ML IJ SOLN
INTRAMUSCULAR | Status: DC | PRN
Start: 2023-10-03 — End: 2023-10-03
  Administered 2023-10-03: 15 mg via INTRAVENOUS

## 2023-10-03 MED ORDER — HYDROMORPHONE HCL 1 MG/ML IJ SOLN
INTRAMUSCULAR | Status: AC
  Filled 2023-10-03: qty 1

## 2023-10-03 MED ORDER — DEXMEDETOMIDINE HCL IN NACL 80 MCG/20ML IV SOLN
INTRAVENOUS | Status: DC | PRN
  Administered 2023-10-03: 8 ug via INTRAVENOUS
  Administered 2023-10-03: 12 ug via INTRAVENOUS

## 2023-10-03 MED ORDER — HYDROMORPHONE HCL 2 MG/ML IJ SOLN
INTRAMUSCULAR | Status: AC
  Filled 2023-10-03: qty 1

## 2023-10-03 MED ORDER — ACETAMINOPHEN 500 MG PO TABS
1000.0000 mg | ORAL_TABLET | Freq: Once | ORAL | Status: AC
  Administered 2023-10-03: 1000 mg via ORAL
  Filled 2023-10-03: qty 2

## 2023-10-03 MED ORDER — PROPOFOL 10 MG/ML IV BOLUS
INTRAVENOUS | Status: AC
  Filled 2023-10-03: qty 20

## 2023-10-03 MED ORDER — ONDANSETRON HCL 4 MG/2ML IJ SOLN
INTRAMUSCULAR | Status: AC
  Filled 2023-10-03: qty 2

## 2023-10-03 MED ORDER — BUPIVACAINE HCL (PF) 0.25 % IJ SOLN
INTRAMUSCULAR | Status: DC | PRN
  Administered 2023-10-03: 10 mL

## 2023-10-03 MED ORDER — CHLORHEXIDINE GLUCONATE 0.12 % MT SOLN
15.0000 mL | Freq: Once | OROMUCOSAL | Status: AC
Start: 2023-10-03 — End: 2023-10-03
  Administered 2023-10-03: 15 mL via OROMUCOSAL

## 2023-10-03 MED ORDER — ONDANSETRON HCL 4 MG/2ML IJ SOLN
INTRAMUSCULAR | Status: DC | PRN
  Administered 2023-10-03: 4 mg via INTRAVENOUS

## 2023-10-03 MED ORDER — KETOROLAC TROMETHAMINE 30 MG/ML IJ SOLN
INTRAMUSCULAR | Status: AC
  Filled 2023-10-03: qty 1

## 2023-10-03 MED ORDER — ONDANSETRON HCL 4 MG/2ML IJ SOLN: 4.0000 mg | Freq: Once | INTRAMUSCULAR | Status: DC | PRN

## 2023-10-03 MED ORDER — SODIUM CHLORIDE 0.9 % IR SOLN
Status: DC | PRN
  Administered 2023-10-03: 6000 mL

## 2023-10-03 MED ORDER — FENTANYL CITRATE (PF) 100 MCG/2ML IJ SOLN
INTRAMUSCULAR | Status: AC
  Filled 2023-10-03: qty 2

## 2023-10-03 MED ORDER — OXYCODONE HCL 5 MG/5ML PO SOLN: 5.0000 mg | Freq: Once | ORAL | Status: AC | PRN

## 2023-10-03 MED ORDER — ORAL CARE MOUTH RINSE: 15.0000 mL | Freq: Once | OROMUCOSAL | Status: AC

## 2023-10-03 MED ORDER — LIDOCAINE HCL (PF) 2 % IJ SOLN
INTRAMUSCULAR | Status: DC | PRN
Start: 2023-10-03 — End: 2023-10-03

## 2023-10-03 MED ORDER — ACETAMINOPHEN 325 MG PO TABS: 650.0000 mg | ORAL_TABLET | Freq: Once | ORAL | Status: DC

## 2023-10-03 MED ORDER — LACTATED RINGERS IV SOLN: INTRAVENOUS | Status: DC | PRN

## 2023-10-03 MED ORDER — MIDAZOLAM HCL 5 MG/5ML IJ SOLN
INTRAMUSCULAR | Status: DC | PRN
  Administered 2023-10-03: 2 mg via INTRAVENOUS

## 2023-10-03 MED ORDER — PROPOFOL 10 MG/ML IV BOLUS
INTRAVENOUS | Status: DC | PRN
  Administered 2023-10-03: 200 mg via INTRAVENOUS

## 2023-10-03 MED ORDER — POVIDONE-IODINE 10 % EX SWAB
2.0000 | Freq: Once | CUTANEOUS | Status: DC
Start: 2023-10-03 — End: 2023-10-03

## 2023-10-03 MED ORDER — KETOROLAC TROMETHAMINE 30 MG/ML IJ SOLN
30.0000 mg | Freq: Once | INTRAMUSCULAR | Status: DC | PRN
Start: 2023-10-03 — End: 2023-10-03

## 2023-10-03 MED ORDER — DEXAMETHASONE SODIUM PHOSPHATE 10 MG/ML IJ SOLN
INTRAMUSCULAR | Status: AC
  Filled 2023-10-03: qty 1

## 2023-10-03 MED ORDER — EPHEDRINE SULFATE (PRESSORS) 50 MG/ML IJ SOLN
INTRAMUSCULAR | Status: DC | PRN
  Administered 2023-10-03: 5 mg via INTRAVENOUS

## 2023-10-03 MED ORDER — LIDOCAINE 2% (20 MG/ML) 5 ML SYRINGE
INTRAMUSCULAR | Status: DC | PRN
  Administered 2023-10-03: 80 mg via INTRAVENOUS

## 2023-10-03 MED ORDER — LACTATED RINGERS IV SOLN: INTRAVENOUS | Status: DC

## 2023-10-03 MED ORDER — PHENYLEPHRINE HCL (PRESSORS) 10 MG/ML IV SOLN
INTRAVENOUS | Status: DC | PRN
  Administered 2023-10-03 (×3): 80 ug via INTRAVENOUS

## 2023-10-03 MED ORDER — FENTANYL CITRATE (PF) 100 MCG/2ML IJ SOLN
INTRAMUSCULAR | Status: DC | PRN
  Administered 2023-10-03 (×2): 50 ug via INTRAVENOUS

## 2023-10-03 SURGICAL SUPPLY — 32 items
BNDG ELASTIC 6INX 5YD STR LF (GAUZE/BANDAGES/DRESSINGS) ×1 IMPLANT
CHLORAPREP W/TINT 26 (MISCELLANEOUS) ×1 IMPLANT
CUFF TRNQT CYL 24X4X16.5-23 (TOURNIQUET CUFF) IMPLANT
CUTTER KNOT PUSHER W/ SKID (INSTRUMENTS) IMPLANT
DISSECTOR 3.8MM X 13CM (MISCELLANEOUS) ×1 IMPLANT
DISSECTOR 4.0MM X 13CM (MISCELLANEOUS) IMPLANT
DRAPE ARTHROSCOPY W/POUCH 114 (DRAPES) ×1 IMPLANT
DRAPE IMP U-DRAPE 54X76 (DRAPES) ×1 IMPLANT
DRAPE U-SHAPE 47X51 STRL (DRAPES) ×1 IMPLANT
DRSG EMULSION OIL 3X3 NADH (GAUZE/BANDAGES/DRESSINGS) ×1 IMPLANT
EXCALIBUR 3.8MM X 13CM (MISCELLANEOUS) IMPLANT
GAUZE PAD ABD 8X10 STRL (GAUZE/BANDAGES/DRESSINGS) IMPLANT
GAUZE SPONGE 4X4 12PLY STRL (GAUZE/BANDAGES/DRESSINGS) ×1 IMPLANT
GLOVE BIO SURGEON STRL SZ7.5 (GLOVE) ×1 IMPLANT
GLOVE BIOGEL PI IND STRL 7.5 (GLOVE) ×1 IMPLANT
GLOVE BIOGEL PI IND STRL 8 (GLOVE) ×1 IMPLANT
GLOVE SURG SYN 7.5 E (GLOVE) ×1 IMPLANT
GLOVE SURG SYN 7.5 PF PI (GLOVE) ×1 IMPLANT
GOWN STRL REUS W/ TWL LRG LVL3 (GOWN DISPOSABLE) ×3 IMPLANT
GOWN STRL REUS W/ TWL XL LVL3 (GOWN DISPOSABLE) ×1 IMPLANT
IMMOBILIZER KNEE 20 THIGH 36 (SOFTGOODS) IMPLANT
IMPL FIBERSTITCH 1.5X24 CVD (Anchor) IMPLANT
KIT BASIN OR (CUSTOM PROCEDURE TRAY) ×1 IMPLANT
MANIFOLD NEPTUNE II (INSTRUMENTS) ×1 IMPLANT
PACK ARTHROSCOPY WL (CUSTOM PROCEDURE TRAY) ×1 IMPLANT
SUT ETHILON 3 0 PS 1 (SUTURE) ×1 IMPLANT
TAPE CLOTH 4X10 WHT NS (GAUZE/BANDAGES/DRESSINGS) IMPLANT
TOWEL GREEN STERILE FF (TOWEL DISPOSABLE) ×1 IMPLANT
TUBING ARTHROSCOPY IRRIG 16FT (MISCELLANEOUS) ×1 IMPLANT
WAND ABLATOR APOLLO I90 (BUR) IMPLANT
WATER STERILE IRR 1000ML POUR (IV SOLUTION) ×1 IMPLANT
WRAP KNEE MAXI GEL POST OP (GAUZE/BANDAGES/DRESSINGS) IMPLANT

## 2023-10-03 NOTE — Interval H&P Note (Signed)
 History and Physical Interval Note:  10/03/2023 10:20 AM  Karen Moody  has presented today for surgery, with the diagnosis of LEFT KNEE LATERAL MENISCUS TEAR.  The various methods of treatment have been discussed with the patient and family. After consideration of risks, benefits and other options for treatment, the patient has consented to  Procedure(s): ARTHROSCOPY, KNEE, WITH LATERAL MENISCECTOMY (Left) REPAIR, MENISCUS, KNEE (Left) as a surgical intervention.  The patient's history has been reviewed, patient examined, no change in status, stable for surgery.  I have reviewed the patient's chart and labs.  Questions were answered to the patient's satisfaction.     Karen Moody

## 2023-10-03 NOTE — Transfer of Care (Signed)
 Immediate Anesthesia Transfer of Care Note  Patient: Karen Moody  Procedure(s) Performed: ARTHROSCOPY, KNEE, WITH LATERAL MENISCECTOMY (Left: Knee) REPAIR, MENISCUS, KNEE; MICROFRACTURE (Left: Knee)  Patient Location: PACU  Anesthesia Type:General  Level of Consciousness: awake and alert   Airway & Oxygen Therapy: Patient Spontanous Breathing and Patient connected to nasal cannula oxygen  Post-op Assessment: Report given to RN and Post -op Vital signs reviewed and stable  Post vital signs: Reviewed and stable  Last Vitals:  Vitals Value Taken Time  BP 111/68 10/03/23 12:49  Temp    Pulse 83 10/03/23 12:50  Resp 17 10/03/23 12:50  SpO2 100 % 10/03/23 12:50  Vitals shown include unfiled device data.  Last Pain:  Vitals:   10/03/23 0931  TempSrc: Oral  PainSc: 0-No pain         Complications: No notable events documented.

## 2023-10-03 NOTE — Op Note (Signed)
 10/03/2023  12:49 PM  PATIENT:  Karen Moody    PRE-OPERATIVE DIAGNOSIS:  LEFT KNEE LATERAL MENISCUS TEAR  POST-OPERATIVE DIAGNOSIS:  Same  PROCEDURE:  ARTHROSCOPY, KNEE, WITH LATERAL MENISCECTOMY, REPAIR, MENISCUS, KNEE; MICROFRACTURE  SURGEON:  Evalene JONETTA Chancy, MD  ASSISTANT: Gerard Large, PA-C, he was present and scrubbed throughout the case, critical for completion in a timely fashion, and for retraction, instrumentation, and closure.   ANESTHESIA:   General  BLOOD LOSS: min  COMPLICATIONS: None   PREOPERATIVE INDICATIONS:  [name] is a  28 y.o. female with a diagnosis of LEFT KNEE LATERAL MENISCUS TEAR who failed conservative measures and elected for surgical management.    The risks benefits and alternatives were discussed with the patient preoperatively including but not limited to the risks of infection, bleeding, nerve injury, cardiopulmonary complications, the need for revision surgery, among others, and the patient was willing to proceed.  OPERATIVE IMPLANTS: Arthrex fiber stitch  OPERATIVE FINDINGS: Examination under anesthesia: Stable block to extension Diagnostic Arthroscopy:  articular cartilage: Stable Medial meniscus: Stable Lateral meniscus: Bucket handle red-white tear Anterior cruciate ligament/PCL: Intact Loose bodies: None    OPERATIVE PROCEDURE:  Patient was identified in the preoperative holding area and site was marked by me female was transported to the operating theater and placed on the table in supine position taking care to pad all bony prominences. After a preincinduction time out anesthesia was induced.  female received Ancef for preoperative antibiotics. The left lower extremity was prepped and draped in normal sterile fashion and a pre-incision timeout was performed.   A small stab incision was made in the anterolateral portal position. The arthroscope was introduced in the joint. A medial portal was then established under direct  visualization just above the anterior horn of the medial meniscus. Diagnostic arthroscopy was then carried out with findings as described above.  I performed a reduction of the lateral meniscus that was in the red-white portion so elected to perform a repair I was careful to avoid the popliteus tendon and placed 3 fiber stitch horizontal anchors across the torn meniscus I was very happy with the reduction and stability of it  Next I performed a microfracture within the notch this would aid in blood's flow and healing of the meniscus  The arthroscopic equipment was removed from the joint and the portals were closed with 3-0 nylon in an interrupted fashion. The knee was infiltrated with Marcaine  10 cc.  Sterile dressings were then applied including Xeroform 4 x 4's ABDs an ACE bandage.  The patient was then allowed to awaken from general anesthesia, transferred to the stretcher and taken to the recovery room in stable condition.  POSTOPERATIVE PLAN: The patient will be discharged home today and will followup in one week for suture removal and wound check.  VTE prophylaxis: Aspirin nonweightbearing

## 2023-10-03 NOTE — Discharge Instructions (Addendum)

## 2023-10-03 NOTE — Anesthesia Procedure Notes (Signed)
 Procedure Name: LMA Insertion Date/Time: 10/03/2023 11:40 AM  Performed by: Dartha Meckel, CRNAPre-anesthesia Checklist: Patient identified, Emergency Drugs available, Suction available and Patient being monitored Patient Re-evaluated:Patient Re-evaluated prior to induction Oxygen Delivery Method: Circle system utilized Preoxygenation: Pre-oxygenation with 100% oxygen Induction Type: IV induction Ventilation: Mask ventilation without difficulty LMA: LMA inserted LMA Size: 4.0 Tube type: Oral Number of attempts: 1 Airway Equipment and Method: Stylet and Oral airway Placement Confirmation: positive ETCO2 and breath sounds checked- equal and bilateral Tube secured with: Tape Dental Injury: Teeth and Oropharynx as per pre-operative assessment

## 2023-10-04 ENCOUNTER — Encounter (HOSPITAL_COMMUNITY): Payer: Self-pay | Admitting: Orthopedic Surgery

## 2023-10-04 NOTE — Anesthesia Postprocedure Evaluation (Signed)
 Anesthesia Post Note  Patient: Karen Moody  Procedure(s) Performed: ARTHROSCOPY, KNEE, WITH LATERAL MENISCECTOMY (Left: Knee) REPAIR, MENISCUS, KNEE; MICROFRACTURE (Left: Knee)     Patient location during evaluation: PACU Anesthesia Type: General Level of consciousness: awake and alert, oriented and patient cooperative Pain management: pain level controlled Vital Signs Assessment: post-procedure vital signs reviewed and stable Respiratory status: spontaneous breathing, nonlabored ventilation and respiratory function stable Cardiovascular status: blood pressure returned to baseline and stable Postop Assessment: no apparent nausea or vomiting Anesthetic complications: no   No notable events documented.  Last Vitals:  Vitals:   10/03/23 1345 10/03/23 1400  BP: 106/64 116/80  Pulse: 86 95  Resp: 11 18  Temp:    SpO2: 100% 99%    Last Pain:  Vitals:   10/03/23 1400  TempSrc:   PainSc: 0-No pain                 Almarie CHRISTELLA Marchi

## 2023-10-10 ENCOUNTER — Encounter (HOSPITAL_COMMUNITY): Payer: Self-pay | Admitting: Orthopedic Surgery

## 2023-10-20 DIAGNOSIS — F411 Generalized anxiety disorder: Secondary | ICD-10-CM | POA: Diagnosis not present

## 2023-10-27 DIAGNOSIS — F411 Generalized anxiety disorder: Secondary | ICD-10-CM | POA: Diagnosis not present

## 2023-11-10 DIAGNOSIS — S83282A Other tear of lateral meniscus, current injury, left knee, initial encounter: Secondary | ICD-10-CM | POA: Diagnosis not present

## 2023-11-10 DIAGNOSIS — S83242A Other tear of medial meniscus, current injury, left knee, initial encounter: Secondary | ICD-10-CM | POA: Diagnosis not present

## 2023-11-11 DIAGNOSIS — F411 Generalized anxiety disorder: Secondary | ICD-10-CM | POA: Diagnosis not present

## 2023-11-23 DIAGNOSIS — R262 Difficulty in walking, not elsewhere classified: Secondary | ICD-10-CM | POA: Diagnosis not present

## 2023-11-23 DIAGNOSIS — M25562 Pain in left knee: Secondary | ICD-10-CM | POA: Diagnosis not present

## 2023-11-28 DIAGNOSIS — M25562 Pain in left knee: Secondary | ICD-10-CM | POA: Diagnosis not present

## 2023-11-28 DIAGNOSIS — R262 Difficulty in walking, not elsewhere classified: Secondary | ICD-10-CM | POA: Diagnosis not present

## 2023-11-29 DIAGNOSIS — M25562 Pain in left knee: Secondary | ICD-10-CM | POA: Diagnosis not present

## 2023-11-29 DIAGNOSIS — R262 Difficulty in walking, not elsewhere classified: Secondary | ICD-10-CM | POA: Diagnosis not present

## 2023-11-30 DIAGNOSIS — R262 Difficulty in walking, not elsewhere classified: Secondary | ICD-10-CM | POA: Diagnosis not present

## 2023-11-30 DIAGNOSIS — M25562 Pain in left knee: Secondary | ICD-10-CM | POA: Diagnosis not present

## 2023-12-09 DIAGNOSIS — F411 Generalized anxiety disorder: Secondary | ICD-10-CM | POA: Diagnosis not present

## 2023-12-25 DIAGNOSIS — M25562 Pain in left knee: Secondary | ICD-10-CM | POA: Diagnosis not present

## 2023-12-25 DIAGNOSIS — R262 Difficulty in walking, not elsewhere classified: Secondary | ICD-10-CM | POA: Diagnosis not present

## 2023-12-27 DIAGNOSIS — R262 Difficulty in walking, not elsewhere classified: Secondary | ICD-10-CM | POA: Diagnosis not present

## 2023-12-27 DIAGNOSIS — M25562 Pain in left knee: Secondary | ICD-10-CM | POA: Diagnosis not present

## 2023-12-29 DIAGNOSIS — R262 Difficulty in walking, not elsewhere classified: Secondary | ICD-10-CM | POA: Diagnosis not present

## 2023-12-29 DIAGNOSIS — M25562 Pain in left knee: Secondary | ICD-10-CM | POA: Diagnosis not present

## 2024-01-01 DIAGNOSIS — R262 Difficulty in walking, not elsewhere classified: Secondary | ICD-10-CM | POA: Diagnosis not present

## 2024-01-01 DIAGNOSIS — M25562 Pain in left knee: Secondary | ICD-10-CM | POA: Diagnosis not present

## 2024-01-03 DIAGNOSIS — M25562 Pain in left knee: Secondary | ICD-10-CM | POA: Diagnosis not present

## 2024-01-03 DIAGNOSIS — R262 Difficulty in walking, not elsewhere classified: Secondary | ICD-10-CM | POA: Diagnosis not present

## 2024-01-05 DIAGNOSIS — R262 Difficulty in walking, not elsewhere classified: Secondary | ICD-10-CM | POA: Diagnosis not present

## 2024-01-05 DIAGNOSIS — M25562 Pain in left knee: Secondary | ICD-10-CM | POA: Diagnosis not present

## 2024-01-08 DIAGNOSIS — R262 Difficulty in walking, not elsewhere classified: Secondary | ICD-10-CM | POA: Diagnosis not present

## 2024-01-08 DIAGNOSIS — M25562 Pain in left knee: Secondary | ICD-10-CM | POA: Diagnosis not present

## 2024-01-10 DIAGNOSIS — M25562 Pain in left knee: Secondary | ICD-10-CM | POA: Diagnosis not present

## 2024-01-10 DIAGNOSIS — R262 Difficulty in walking, not elsewhere classified: Secondary | ICD-10-CM | POA: Diagnosis not present

## 2024-01-11 DIAGNOSIS — F411 Generalized anxiety disorder: Secondary | ICD-10-CM | POA: Diagnosis not present

## 2024-01-17 DIAGNOSIS — R262 Difficulty in walking, not elsewhere classified: Secondary | ICD-10-CM | POA: Diagnosis not present

## 2024-01-17 DIAGNOSIS — M25562 Pain in left knee: Secondary | ICD-10-CM | POA: Diagnosis not present

## 2024-01-19 DIAGNOSIS — M25562 Pain in left knee: Secondary | ICD-10-CM | POA: Diagnosis not present

## 2024-01-19 DIAGNOSIS — R262 Difficulty in walking, not elsewhere classified: Secondary | ICD-10-CM | POA: Diagnosis not present

## 2024-01-23 DIAGNOSIS — R262 Difficulty in walking, not elsewhere classified: Secondary | ICD-10-CM | POA: Diagnosis not present

## 2024-01-23 DIAGNOSIS — M25562 Pain in left knee: Secondary | ICD-10-CM | POA: Diagnosis not present

## 2024-01-25 DIAGNOSIS — M25562 Pain in left knee: Secondary | ICD-10-CM | POA: Diagnosis not present

## 2024-01-25 DIAGNOSIS — R262 Difficulty in walking, not elsewhere classified: Secondary | ICD-10-CM | POA: Diagnosis not present

## 2024-01-30 DIAGNOSIS — M25562 Pain in left knee: Secondary | ICD-10-CM | POA: Diagnosis not present

## 2024-01-30 DIAGNOSIS — R262 Difficulty in walking, not elsewhere classified: Secondary | ICD-10-CM | POA: Diagnosis not present

## 2024-02-01 DIAGNOSIS — R262 Difficulty in walking, not elsewhere classified: Secondary | ICD-10-CM | POA: Diagnosis not present

## 2024-02-01 DIAGNOSIS — M25562 Pain in left knee: Secondary | ICD-10-CM | POA: Diagnosis not present

## 2024-02-08 DIAGNOSIS — M25562 Pain in left knee: Secondary | ICD-10-CM | POA: Diagnosis not present

## 2024-02-08 DIAGNOSIS — R262 Difficulty in walking, not elsewhere classified: Secondary | ICD-10-CM | POA: Diagnosis not present

## 2024-02-13 DIAGNOSIS — R262 Difficulty in walking, not elsewhere classified: Secondary | ICD-10-CM | POA: Diagnosis not present

## 2024-02-13 DIAGNOSIS — M25562 Pain in left knee: Secondary | ICD-10-CM | POA: Diagnosis not present

## 2024-02-16 DIAGNOSIS — Z9889 Other specified postprocedural states: Secondary | ICD-10-CM | POA: Diagnosis not present
